# Patient Record
Sex: Female | Born: 1980 | Race: Black or African American | Hispanic: No | Marital: Single | Smoking: Former smoker
Health system: Southern US, Community
[De-identification: ages and names within clinical notes are randomized; demographics above are authoritative.]

## PROBLEM LIST (undated history)

## (undated) ENCOUNTER — Inpatient Hospital Stay (HOSPITAL_COMMUNITY): Payer: Self-pay

## (undated) DIAGNOSIS — D649 Anemia, unspecified: Secondary | ICD-10-CM

## (undated) DIAGNOSIS — N200 Calculus of kidney: Secondary | ICD-10-CM

## (undated) DIAGNOSIS — Z8489 Family history of other specified conditions: Secondary | ICD-10-CM

## (undated) DIAGNOSIS — E282 Polycystic ovarian syndrome: Secondary | ICD-10-CM

## (undated) DIAGNOSIS — E785 Hyperlipidemia, unspecified: Secondary | ICD-10-CM

## (undated) DIAGNOSIS — F32A Depression, unspecified: Secondary | ICD-10-CM

## (undated) DIAGNOSIS — E079 Disorder of thyroid, unspecified: Secondary | ICD-10-CM

## (undated) DIAGNOSIS — R51 Headache: Secondary | ICD-10-CM

## (undated) DIAGNOSIS — F419 Anxiety disorder, unspecified: Secondary | ICD-10-CM

## (undated) DIAGNOSIS — K219 Gastro-esophageal reflux disease without esophagitis: Secondary | ICD-10-CM

## (undated) DIAGNOSIS — F329 Major depressive disorder, single episode, unspecified: Secondary | ICD-10-CM

## (undated) HISTORY — DX: Anxiety disorder, unspecified: F41.9

## (undated) HISTORY — DX: Calculus of kidney: N20.0

## (undated) HISTORY — DX: Disorder of thyroid, unspecified: E07.9

## (undated) HISTORY — DX: Depression, unspecified: F32.A

## (undated) HISTORY — DX: Hyperlipidemia, unspecified: E78.5

## (undated) HISTORY — DX: Anemia, unspecified: D64.9

## (undated) HISTORY — DX: Major depressive disorder, single episode, unspecified: F32.9

## (undated) HISTORY — DX: Gastro-esophageal reflux disease without esophagitis: K21.9

---
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
## 2005-05-26 ENCOUNTER — Ambulatory Visit (HOSPITAL_COMMUNITY): Admission: RE | Admit: 2005-05-26 | Discharge: 2005-05-26 | Payer: Self-pay | Admitting: Family Medicine

## 2010-02-23 ENCOUNTER — Encounter: Payer: Self-pay | Admitting: Family Medicine

## 2010-11-01 ENCOUNTER — Inpatient Hospital Stay (HOSPITAL_COMMUNITY)
Admission: AD | Admit: 2010-11-01 | Discharge: 2010-11-01 | Disposition: A | Discharge status: Home / Self Care | Payer: Medicaid Other | Source: Ambulatory Visit | Attending: Obstetrics & Gynecology | Admitting: Obstetrics & Gynecology

## 2010-11-01 ENCOUNTER — Encounter (HOSPITAL_COMMUNITY): Payer: Self-pay

## 2010-11-01 DIAGNOSIS — N83202 Unspecified ovarian cyst, left side: Secondary | ICD-10-CM

## 2010-11-01 DIAGNOSIS — N83209 Unspecified ovarian cyst, unspecified side: Secondary | ICD-10-CM

## 2010-11-01 DIAGNOSIS — R1032 Left lower quadrant pain: Secondary | ICD-10-CM | POA: Insufficient documentation

## 2010-11-01 DIAGNOSIS — O99891 Other specified diseases and conditions complicating pregnancy: Secondary | ICD-10-CM | POA: Insufficient documentation

## 2010-11-01 DIAGNOSIS — Z3490 Encounter for supervision of normal pregnancy, unspecified, unspecified trimester: Secondary | ICD-10-CM

## 2010-11-01 HISTORY — DX: Headache: R51

## 2010-11-01 LAB — URINALYSIS, ROUTINE W REFLEX MICROSCOPIC
Hgb urine dipstick: NEGATIVE
Nitrite: NEGATIVE
Specific Gravity, Urine: >1.03 — ABNORMAL HIGH (ref 1.005–1.030)
pH: 6 (ref 5.0–8.0)

## 2010-11-01 LAB — WET PREP, GENITAL
Trich, Wet Prep: NONE SEEN
Yeast Wet Prep HPF POC: NONE SEEN

## 2010-11-01 LAB — POCT PREGNANCY, URINE: Preg Test, Ur: POSITIVE

## 2010-11-01 NOTE — Progress Notes (Signed)
Period for last few months have been a little irregular, no bleeding in September, two positive pregnancy tests at home, hurting on left side, went to ER in [place] had bloodwork and [place] too early to visualize a pregnancy, ?left ovarian cyst, LMP 09/05/10 no bleeding today pain still present.

## 2010-11-01 NOTE — Progress Notes (Signed)
Pt is [redacted] weeks pregnant went to ER in [place] and never found out if HCG levels were rising appropriately. Pt wanting to know if pregnancy is healthy. Pt is having L. Sided pain and is concerned about that.  Plan is for patient to move to [place] next week and start prenatal care there.

## 2010-11-01 NOTE — ED Provider Notes (Signed)
Chief Complaint:  Abdominal Pain   Jaclyn Macdonald is  30 y.o. [id number].  Patient's last menstrual period was 09/05/2010. [redacted]w[redacted]d  Her pregnancy status is positive.  She presents complaining of Abdominal Pain . Onset is described as gradual and has been present for  several weeks. Previous care in [place], states was told nothing visible on [place] and didn't follow up quants as instructed. Reports LLQ pain, was told in [place] that she had an ovarian cyst. Denies blding, discharge, dysuria, fever, or back pain.  Obstetrical/Gynecological History: OB History    Grav Para Term Preterm Abortions TAB SAB Ect Mult Living   5 2 2  2 1 1   2       Past Medical History: Past Medical History  Diagnosis Date  . Headache     Past Surgical History: Past Surgical History  Procedure Date  . No past surgeries     Family History: No family history on file.  Social History: History  Substance Use Topics  . Smoking status: Former Games developer  . Smokeless tobacco: Not on file  . Alcohol Use: No    Allergies: No Known Allergies  Prescriptions prior to admission  Medication Sig Dispense Refill  . OVER THE COUNTER MEDICATION Take 1 tablet by mouth daily as needed. For allergies       . prenatal vitamin w/FE, FA (PRENATAL 1 + 1) 27-1 MG TABS Take 1 tablet by mouth daily.          Review of Systems - Negative except what has been reviewed in the HPI  Physical Exam   Blood pressure 114/72, pulse 74, temperature 98.8 F (37.1 C), temperature source Oral, resp. rate 18, height 5\' 4"  (1.626 m), weight 72.303 kg (159 lb 6.4 oz), last menstrual period 09/05/2010.  General: General appearance - alert, well appearing, and in no distress and oriented to person, place, and time Abdomen - tenderness noted LLQ, no rebound, no guarding, +BS Focused Gynecological Exam: VULVA: normal appearing vulva with no masses, tenderness or lesions, VAGINA: normal appearing vagina with normal color and discharge, no  lesions, CERVIX: normal appearing cervix without discharge or lesions, UTERUS: uterus is normal size, shape, consistency and nontender, ADNEXA: normal adnexa in size, nontender and no masses  Labs: Recent Results (from the past 24 hour(s))  URINALYSIS, ROUTINE W REFLEX MICROSCOPIC   Collection Time   11/01/10  5:40 PM      Component Value Range   Color, Urine YELLOW  YELLOW    Appearance CLEAR  CLEAR    Specific Gravity, Urine >1.030 (*) 1.005 - 1.030    pH 6.0  5.0 - 8.0    Glucose, UA NEGATIVE  NEGATIVE (mg/dL)   Hgb urine dipstick NEGATIVE  NEGATIVE    Bilirubin Urine NEGATIVE  NEGATIVE    Ketones, ur NEGATIVE  NEGATIVE (mg/dL)   Protein, ur NEGATIVE  NEGATIVE (mg/dL)   Urobilinogen, UA 0.2  0.0 - 1.0 (mg/dL)   Nitrite NEGATIVE  NEGATIVE    Leukocytes, UA NEGATIVE  NEGATIVE   POCT PREGNANCY, URINE   Collection Time   11/01/10  5:45 PM      Component Value Range   Preg Test, Ur POSITIVE    WET PREP, GENITAL   Collection Time   11/01/10  7:15 PM      Component Value Range   Yeast, Wet Prep NONE SEEN  NONE SEEN    Trich, Wet Prep NONE SEEN  NONE SEEN    Clue Cells, Wet Prep  NONE SEEN  NONE SEEN    WBC, Wet Prep HPF POC FEW (*) NONE SEEN    Imaging Studies:  Viable IUP. CRL [redacted]w[redacted]d, c/w LMP, + cardiac activity, + yolk sac   Assessment: Viable IUP at [redacted]w[redacted]d Pelvic Pain  Plan: Discharge home FU with OB/Gyn as scheduled in [place]  Lilianna Case E. 11/01/2010,8:03 PM

## 2011-04-13 ENCOUNTER — Encounter (HOSPITAL_COMMUNITY): Payer: Self-pay | Admitting: Advanced Practice Midwife

## 2011-04-13 ENCOUNTER — Inpatient Hospital Stay (HOSPITAL_COMMUNITY)
Admission: AD | Admit: 2011-04-13 | Discharge: 2011-04-14 | Disposition: A | Discharge status: Home Health | Payer: Medicaid Other | Attending: Obstetrics & Gynecology | Admitting: Obstetrics & Gynecology

## 2011-04-13 DIAGNOSIS — O2240 Hemorrhoids in pregnancy, unspecified trimester: Secondary | ICD-10-CM

## 2011-04-13 DIAGNOSIS — K649 Unspecified hemorrhoids: Secondary | ICD-10-CM | POA: Insufficient documentation

## 2011-04-13 DIAGNOSIS — O228X9 Other venous complications in pregnancy, unspecified trimester: Secondary | ICD-10-CM | POA: Insufficient documentation

## 2011-04-13 NOTE — MAU Note (Signed)
Pt states she urinatted and when she got up there was blood in the commode-she wiped and found it to be coming from her rectum-sttes it was not her vagina

## 2011-04-13 NOTE — MAU Note (Signed)
Pt was up tovoid at home and noticed blood in toilet when she wiped.  Denies any burning when she voids.  State she does have hemmoroids.

## 2011-04-14 ENCOUNTER — Encounter (HOSPITAL_COMMUNITY): Payer: Self-pay | Admitting: Family

## 2011-04-14 LAB — URINALYSIS, ROUTINE W REFLEX MICROSCOPIC
Glucose, UA: NEGATIVE mg/dL
Ketones, ur: NEGATIVE mg/dL
Leukocytes, UA: NEGATIVE
Nitrite: NEGATIVE
Protein, ur: NEGATIVE mg/dL
Specific Gravity, Urine: 1.02 (ref 1.005–1.030)
Urobilinogen, UA: 0.2 mg/dL (ref 0.0–1.0)
pH: 6.5 (ref 5.0–8.0)

## 2011-04-14 NOTE — Discharge Instructions (Signed)
Hemorrhoids Hemorrhoids are veins in the rectum that get big. These veins can get blocked. Blocked veins become puffy (swollen) and painful. HOME CARE  Eat more fiber.   Drink enough fluid to keep your pee (urine) clear or pale yellow.   Exercise often.   Avoid straining to poop (bowel movement).   Keep the butt area dry and clean.   Only take medicine as told by your doctor.  If your hemorrhoids are puffy and painful:  Take a warm bath for 20 to 30 minutes. Do this 3 to 4 times a day.   Place ice packs on the area. Use the ice packs between the baths.   Put ice in a plastic bag.   Place a towel between your skin and the bag.   Leave the ice on for 15 to 20 minutes, 3 to 4 times a day.   Do not use a donut-shaped pillow. Do not sit on the toilet for a long time.   Go to the bathroom when your body has the urge to poop. This is so you do not strain as much to poop.  GET HELP RIGHT AWAY IF:   You have increasing pain that is not controlled with medicine.   You have uncontrolled bleeding.   You cannot poop.   You have pain or puffiness outside the area of the hemorrhoids.   You have chills.   You have a temperature by mouth above 102 F (38.9 C), not controlled by medicine.  MAKE SURE YOU:   Understand these instructions.   Will watch your condition.   Will get help right away if you are not doing well or get worse.  Document Released: 10/29/2007 Document Revised: 01/08/2011 Document Reviewed: 10/29/2007 [facility] Patient Information 2012 [place], [place].

## 2011-04-14 NOTE — MAU Provider Note (Signed)
  History     CSN: [id number]  Arrival date and time: 04/13/11 2310   First Provider Initiated Contact with Patient 04/14/11 0005      Chief Complaint  Patient presents with  . Rectal Bleeding   HPI  Pt here with report of blood with wiping that started today.  Used a wipe and noticed a small clot on tissue with wiping.  Denies pain at hemorrhoids, UTI symptoms, vaginal bleeding or contractions.    Past Medical History  Diagnosis Date  . Headache     Past Surgical History  Procedure Date  . No past surgeries     History reviewed. No pertinent family history.  History  Substance Use Topics  . Smoking status: Former Smoker    Types: Cigarettes    Quit date: 03/30/2011  . Smokeless tobacco: Not on file  . Alcohol Use: No    Allergies: No Known Allergies  Prescriptions prior to admission  Medication Sig Dispense Refill  . Prenatal Vit-Fe Fumarate-FA (PRENATAL MULTIVITAMIN) TABS Take 1 tablet by mouth daily.        Review of Systems  Genitourinary:       Rectal bleeding  All other systems reviewed and are negative.   Physical Exam   Blood pressure 115/68, pulse 110, temperature 98.4 F (36.9 C), temperature source Oral, resp. rate 20, height 5' 4.5" (1.638 m), weight 91.173 kg (201 lb), last menstrual period 09/05/2010, SpO2 99.00%.  Physical Exam  Constitutional: She is oriented to person, place, and time. She appears well-developed and well-nourished. No distress.  HENT:  Head: Normocephalic.  Neck: Normal range of motion. Neck supple.  Cardiovascular: Normal rate, regular rhythm and normal heart sounds.   Respiratory: Effort normal and breath sounds normal.  Genitourinary: Rectal exam shows external hemorrhoid (no active bleeding seen) and internal hemorrhoid (no active bleeding seen). No bleeding (cervix closed) around the vagina.  Neurological: She is alert and oriented to person, place, and time.  Skin: Skin is warm and dry.    MAU Course    Procedures Results for orders placed during the hospital encounter of 04/13/11 (from the past 24 hour(s))  URINALYSIS, ROUTINE W REFLEX MICROSCOPIC     Status: Normal   Collection Time   04/13/11 11:15 PM      Component Value Range   Color, Urine YELLOW  YELLOW    APPearance CLEAR  CLEAR    Specific Gravity, Urine 1.020  1.005 - 1.030    pH 6.5  5.0 - 8.0    Glucose, UA NEGATIVE  NEGATIVE (mg/dL)   Hgb urine dipstick NEGATIVE  NEGATIVE    Bilirubin Urine NEGATIVE  NEGATIVE    Ketones, ur NEGATIVE  NEGATIVE (mg/dL)   Protein, ur NEGATIVE  NEGATIVE (mg/dL)   Urobilinogen, UA 0.2  0.0 - 1.0 (mg/dL)   Nitrite NEGATIVE  NEGATIVE    Leukocytes, UA NEGATIVE  NEGATIVE    FHR 140's, +accel, reactive Toco - none  Assessment and Plan  Hemorrhoids  Plan: DC home Increase fiber F/U with scheduled prenatal care visit.  [facility] 04/14/2011, 12:06 AM

## 2011-04-14 NOTE — Progress Notes (Signed)
One sm hemmoroid on left rectal wall.

## 2011-04-25 NOTE — MAU Provider Note (Signed)
Medical Screening exam and patient care preformed by advanced practice provider.  Agree with the above management.  

## 2013-12-04 ENCOUNTER — Encounter (HOSPITAL_COMMUNITY): Payer: Self-pay | Admitting: Family

## 2014-09-18 ENCOUNTER — Ambulatory Visit (INDEPENDENT_AMBULATORY_CARE_PROVIDER_SITE_OTHER): Payer: Medicare Other | Admitting: Neurology

## 2014-09-18 ENCOUNTER — Encounter: Payer: Self-pay | Admitting: Neurology

## 2014-09-18 VITALS — BP 107/70 | HR 77 | Ht 64.5 in | Wt 167.0 lb

## 2014-09-18 DIAGNOSIS — G5601 Carpal tunnel syndrome, right upper limb: Secondary | ICD-10-CM

## 2014-09-18 NOTE — Progress Notes (Signed)
PATIENT: Jaclyn Macdonald DOB: 1980/11/25  Chief Complaint  Patient presents with  . Numbness    She has been having right-handed numbness and tingling for the last 1.5 years.  She has also developed pain in her right wrist that becomes worse with positional changes.  She has some relief wearing an immobilization brace.     HISTORICAL  Jaclyn Macdonald is a 34 years old right-handed female, seen in refer by her primary care Scott Long for evaluation of right hand paresthesia.  She reported a history of depression anxiety, I also reviewed and summarized her most recent office visit, laboratory evaluation normal A1c 5.7, TSH, 0.587, normal CMP, lipid profile showed triglyceride 131, LDL 103, normal CBC, with exception of mild anemia 10.8  She began to notice right hand paresthesia since 2014, there was no clear trigger notice, initially she also woke up in the middle of the night felt dense numbness of her right hand, she has to shake her hand to make the sensation comes back.  Over the past few months, is getting worse, she has right hand paresthesia during the daytime, numbness tingling, drawing sensation of her right hand, she denies significant neck pain, no right hand weakness. She denies left hand sensory loss or weakness, she denies gait difficulty    REVIEW OF SYSTEMS: Full 14 system review of systems performed and notable only for fatigue, feeling hot, feeling cold, joint pain, numbness, weakness, sleepiness, depression, anxiety, decreased energy, change in appetite, disinteresting activities  ALLERGIES: No Known Allergies  HOME MEDICATIONS: Current Outpatient Prescriptions  Medication Sig Dispense Refill  . ferrous sulfate 325 (65 FE) MG tablet Take 325 mg by mouth 2 (two) times daily.  0  . VITAMIN D, ERGOCALCIFEROL, PO Take by mouth daily.     No current facility-administered medications for this visit.    PAST MEDICAL HISTORY: Past Medical History  Diagnosis Date    . Headache(784.0)   . Depression   . Anxiety     PAST SURGICAL HISTORY: Past Surgical History  Procedure Laterality Date  . Cesarean section  2013    FAMILY HISTORY: Family History  Problem Relation Age of Onset  . Hypertension Father   . Heart disease Father   . Asthma Mother   . Rashes / Skin problems Mother     SOCIAL HISTORY:  Social History   Social History  . Marital Status: Single    Spouse Name: N/A  . Number of Children: 3  . Years of Education: 14   Occupational History  . Unemployed    Social History Main Topics  . Smoking status: Former Smoker -- 0.80 packs/day    Types: Cigarettes    Quit date: 03/30/2011  . Smokeless tobacco: Not on file  . Alcohol Use: 0.0 oz/week    0 Standard drinks or equivalent per week     Comment: Two drinks per week.  . Drug Use: No  . Sexual Activity: Yes   Other Topics Concern  . Not on file   Social History Narrative   Lives at home with three children.   Right-handed.   1 cup coffee per day.     PHYSICAL EXAM   Filed Vitals:   09/18/14 1023  BP: 107/70  Pulse: 77  Height: 5' 4.5" (1.638 m)  Weight: 167 lb (75.751 kg)    Not recorded      Body mass index is 28.23 kg/(m^2).  PHYSICAL EXAMNIATION:  Gen: NAD, conversant, well nourised, obese, well  groomed                     Cardiovascular: Regular rate rhythm, no peripheral edema, warm, nontender. Eyes: Conjunctivae clear without exudates or hemorrhage Neck: Supple, no carotid bruise. Pulmonary: Clear to auscultation bilaterally   NEUROLOGICAL EXAM:  MENTAL STATUS: Speech:    Speech is normal; fluent and spontaneous with normal comprehension.  Cognition:     Orientation to time, place and person     Normal recent and remote memory     Normal Attention span and concentration     Normal Language, naming, repeating,spontaneous speech     Fund of knowledge   CRANIAL NERVES: CN II: Visual fields are full to confrontation. Fundoscopic exam is  normal with sharp discs and no vascular changes. Pupils are round equal and briskly reactive to light. CN III, IV, VI: extraocular movement are normal. No ptosis. CN V: Facial sensation is intact to pinprick in all 3 divisions bilaterally. Corneal responses are intact.  CN VII: Face is symmetric with normal eye closure and smile. CN VIII: Hearing is normal to rubbing fingers CN IX, X: Palate elevates symmetrically. Phonation is normal. CN XI: Head turning and shoulder shrug are intact CN XII: Tongue is midline with normal movements and no atrophy.  MOTOR: There is no pronator drift of out-stretched arms. Muscle bulk and tone are normal. Muscle strength is normal.  REFLEXES: Reflexes are 2+ and symmetric at the biceps, triceps, knees, and ankles. Plantar responses are flexor.  SENSORY: Intact to light touch, pinprick, position sense, and vibration sense are intact in fingers and toes with exception of decreased light touch pinprick at right first 3 fingerpads, she has right wrist Tinel sign.  COORDINATION: Rapid alternating movements and fine finger movements are intact. There is no dysmetria on finger-to-nose and heel-knee-shin.    GAIT/STANCE: Posture is normal. Gait is steady with normal steps, base, arm swing, and turning. Heel and toe walking are normal. Tandem gait is normal.  Romberg is absent.   DIAGNOSTIC DATA (LABS, IMAGING, TESTING) - I reviewed patient records, labs, notes, testing and imaging myself where available.  ASSESSMENT AND PLAN  Jaclyn Macdonald is a 34 y.o. female   Right hand paresthesia  Most consistent with right carpal tunnel syndromes  EMG nerve conduction study  Prescription for right wrist splint  When necessary NSAIDs  Marcial Pacas, M.D. Ph.D.  [facility] [address] [place],  [address] Ph: [phone] Fax: [phone]  CC: Shanon Rosser

## 2014-10-22 ENCOUNTER — Ambulatory Visit (INDEPENDENT_AMBULATORY_CARE_PROVIDER_SITE_OTHER): Payer: Medicare Other | Admitting: Neurology

## 2014-10-22 ENCOUNTER — Ambulatory Visit (INDEPENDENT_AMBULATORY_CARE_PROVIDER_SITE_OTHER): Payer: Self-pay | Admitting: Neurology

## 2014-10-22 DIAGNOSIS — R202 Paresthesia of skin: Secondary | ICD-10-CM

## 2014-10-22 DIAGNOSIS — G5601 Carpal tunnel syndrome, right upper limb: Secondary | ICD-10-CM

## 2014-10-22 DIAGNOSIS — Z0289 Encounter for other administrative examinations: Secondary | ICD-10-CM

## 2014-10-22 NOTE — Procedures (Signed)
   NCS (NERVE CONDUCTION STUDY) WITH EMG (ELECTROMYOGRAPHY) REPORT   STUDY DATE: October 22 2014 PATIENT NAME: Jaclyn Macdonald DOB: [birth year] MRN: [id number]    TECHNOLOGIST: Laretta Alstrom ELECTROMYOGRAPHER: Marcial Pacas M.D.  CLINICAL INFORMATION:  34 years old right-handed female, with intermittent bilateral hands paresthesia since 2014  FINDINGS: NERVE CONDUCTION STUDY: Bilateral ulnar sensory and motor responses were normal. Bilateral median sensory and motor responses were normal and symmetric.  NEEDLE ELECTROMYOGRAPHY: Selected needle examination was performed at right upper extremity muscles, right cervical paraspinal muscles.  Needle examination of right pronator teres, brachioradialis, triceps, biceps deltoid was normal.  There was no spontaneous activity at right cervical paraspinal muscles, right C5-6 and 7.  IMPRESSION:   This is a normal study. There is no electrodiagnostic evidence of right upper extremity neuropathy, or right cervical radiculopathy.   INTERPRETING PHYSICIAN:   Marcial Pacas M.D. Ph.D. [facility] [address] [place], [place] [address] [phone]

## 2014-10-22 NOTE — Progress Notes (Signed)
Electrodiagnostic study is normal today, there is no evidence of right carpal tunnel syndromes or right cervical radiculopathy.  Her right hand paresthesia has responded to right wrist splint.

## 2015-03-07 ENCOUNTER — Other Ambulatory Visit: Payer: Self-pay | Admitting: Internal Medicine

## 2015-03-07 DIAGNOSIS — R7989 Other specified abnormal findings of blood chemistry: Secondary | ICD-10-CM

## 2015-03-13 ENCOUNTER — Ambulatory Visit
Admission: RE | Admit: 2015-03-13 | Discharge: 2015-03-13 | Disposition: A | Discharge status: Home / Self Care | Payer: Medicare Other | Source: Ambulatory Visit | Attending: Internal Medicine | Admitting: Internal Medicine

## 2015-03-13 DIAGNOSIS — R7989 Other specified abnormal findings of blood chemistry: Secondary | ICD-10-CM

## 2015-04-03 ENCOUNTER — Other Ambulatory Visit (HOSPITAL_COMMUNITY): Payer: Self-pay | Admitting: Internal Medicine

## 2015-04-03 DIAGNOSIS — R7989 Other specified abnormal findings of blood chemistry: Secondary | ICD-10-CM

## 2015-04-18 ENCOUNTER — Encounter (HOSPITAL_COMMUNITY)
Admission: RE | Admit: 2015-04-18 | Discharge: 2015-04-18 | Disposition: A | Discharge status: Home / Self Care | Payer: Medicare Other | Source: Ambulatory Visit | Attending: Internal Medicine | Admitting: Internal Medicine

## 2015-04-18 DIAGNOSIS — R7989 Other specified abnormal findings of blood chemistry: Secondary | ICD-10-CM

## 2015-04-18 DIAGNOSIS — R946 Abnormal results of thyroid function studies: Secondary | ICD-10-CM | POA: Insufficient documentation

## 2015-04-18 MED ORDER — SODIUM IODIDE I 131 CAPSULE
12.2000 | Freq: Once | INTRAVENOUS | Status: AC
  Administered 2015-04-18: 12.2 via ORAL

## 2015-04-19 ENCOUNTER — Encounter (HOSPITAL_COMMUNITY)
Admission: RE | Admit: 2015-04-19 | Discharge: 2015-04-19 | Disposition: A | Discharge status: Home / Self Care | Payer: Medicare Other | Source: Ambulatory Visit | Attending: Internal Medicine | Admitting: Internal Medicine

## 2015-04-19 DIAGNOSIS — R946 Abnormal results of thyroid function studies: Secondary | ICD-10-CM | POA: Diagnosis present

## 2016-05-15 DIAGNOSIS — E282 Polycystic ovarian syndrome: Secondary | ICD-10-CM

## 2016-05-15 HISTORY — DX: Polycystic ovarian syndrome: E28.2

## 2018-10-27 DIAGNOSIS — F411 Generalized anxiety disorder: Secondary | ICD-10-CM | POA: Diagnosis not present

## 2018-11-01 ENCOUNTER — Other Ambulatory Visit: Payer: Self-pay | Admitting: *Deleted

## 2018-11-01 DIAGNOSIS — N644 Mastodynia: Secondary | ICD-10-CM

## 2018-11-09 ENCOUNTER — Encounter: Payer: Self-pay | Admitting: *Deleted

## 2018-11-10 ENCOUNTER — Encounter

## 2018-11-10 ENCOUNTER — Encounter: Payer: Self-pay | Admitting: Neurology

## 2018-11-10 ENCOUNTER — Ambulatory Visit: Payer: Medicare Other

## 2018-11-10 ENCOUNTER — Ambulatory Visit
Admission: RE | Admit: 2018-11-10 | Discharge: 2018-11-10 | Disposition: A | Discharge status: Home / Self Care | Payer: Medicare HMO | Source: Ambulatory Visit | Attending: *Deleted | Admitting: *Deleted

## 2018-11-10 ENCOUNTER — Other Ambulatory Visit: Payer: Self-pay

## 2018-11-10 ENCOUNTER — Ambulatory Visit (INDEPENDENT_AMBULATORY_CARE_PROVIDER_SITE_OTHER): Payer: Medicare HMO | Admitting: Neurology

## 2018-11-10 VITALS — BP 128/86 | HR 83 | Temp 97.6°F | Ht 64.5 in | Wt 168.0 lb

## 2018-11-10 DIAGNOSIS — R202 Paresthesia of skin: Secondary | ICD-10-CM | POA: Diagnosis not present

## 2018-11-10 DIAGNOSIS — R531 Weakness: Secondary | ICD-10-CM | POA: Diagnosis not present

## 2018-11-10 DIAGNOSIS — R928 Other abnormal and inconclusive findings on diagnostic imaging of breast: Secondary | ICD-10-CM | POA: Diagnosis not present

## 2018-11-10 DIAGNOSIS — M542 Cervicalgia: Secondary | ICD-10-CM | POA: Diagnosis not present

## 2018-11-10 DIAGNOSIS — F411 Generalized anxiety disorder: Secondary | ICD-10-CM | POA: Diagnosis not present

## 2018-11-10 DIAGNOSIS — N644 Mastodynia: Secondary | ICD-10-CM

## 2018-11-10 NOTE — Progress Notes (Addendum)
PATIENT: Jaclyn Macdonald DOB: 30-Jun-1980  Chief Complaint  Patient presents with  . Paresthesia of skin    rm 4, New Pt, seen 2016 "mostly my right arm w/tingling, numbness esp with hot weather; hands were tembling but better now"     HISTORICAL  Jaclyn Macdonald is a 38 year old female, seen in request by primary care physician PA Scott long for evaluation of right numbness and weakness.  I have reviewed and summarized the referring note from the referring physician.  She had a past medical history of diabetes, noticed right arm sometimes weakness since 2014, while driving, her right arm follow-up the steering wheel, saw her previously in 2016 for similar complaints, EMG nerve conduction study showed no evidence of right upper extremity neuropathy or right cervical radiculopathy  Her symptoms has been progressively getting worse, she also complains of neck pain, stiffness of her neck, intermittent radiating pain to left shoulder, left upper extremity  Laboratory evaluation TSH 0.34, hemoglobin of 12.6, LDL of 98 CMP A1c 5.4 LDL 98, cholesterol 172 vitamin B12 508  REVIEW OF SYSTEMS: Full 14 system review of systems performed and notable only for as above All other review of systems were negative.  ALLERGIES: No Known Allergies  HOME MEDICATIONS: Current Outpatient Medications  Medication Sig Dispense Refill  . ferrous sulfate 325 (65 FE) MG tablet Take 325 mg by mouth 2 (two) times daily.  0  . VITAMIN D, ERGOCALCIFEROL, PO Take by mouth daily.    . metFORMIN (GLUCOPHAGE) 500 MG tablet 11/10/18 Not started yet     No current facility-administered medications for this visit.     PAST MEDICAL HISTORY: Past Medical History:  Diagnosis Date  . Anxiety   . Depression   . Headache(784.0)   . Polycystic ovaries 05/15/2016    PAST SURGICAL HISTORY: Past Surgical History:  Procedure Laterality Date  . CESAREAN SECTION  2013    FAMILY HISTORY: Family History  Problem  Relation Age of Onset  . Asthma Mother   . Rashes / Skin problems Mother   . Hypertension Mother   . Hypertension Father   . Heart disease Father     SOCIAL HISTORY: Social History   Socioeconomic History  . Marital status: Single    Spouse name: Not on file  . Number of children: 3  . Years of education: 15  . Highest education level: Not on file  Occupational History  . Occupation: Unemployed  Social Needs  . Financial resource strain: Not on file  . Food insecurity    Worry: Not on file    Inability: Not on file  . Transportation needs    Medical: Not on file    Non-medical: Not on file  Tobacco Use  . Smoking status: Former Smoker    Packs/day: 0.80    Types: Cigarettes    Quit date: 03/30/2011    Years since quitting: 7.6  . Smokeless tobacco: Never Used  Substance and Sexual Activity  . Alcohol use: Yes    Alcohol/week: 0.0 standard drinks    Comment: Two drinks per week.  . Drug use: No  . Sexual activity: Yes  Lifestyle  . Physical activity    Days per week: Not on file    Minutes per session: Not on file  . Stress: Not on file  Relationships  . Social Herbalist on phone: Not on file    Gets together: Not on file    Attends religious service: Not  on file    Active member of club or organization: Not on file    Attends meetings of clubs or organizations: Not on file    Relationship status: Not on file  . Intimate partner violence    Fear of current or ex partner: Not on file    Emotionally abused: Not on file    Physically abused: Not on file    Forced sexual activity: Not on file  Other Topics Concern  . Not on file  Social History Narrative   Lives at home with three children.   Right-handed.   1 cup coffee per day.     PHYSICAL EXAM   Vitals:   11/10/18 0812  BP: 128/86  Pulse: 83  Temp: 97.6 F (36.4 C)  Weight: 168 lb (76.2 kg)  Height: 5' 4.5" (1.638 m)    Not recorded      Body mass index is 28.39 kg/m.   PHYSICAL EXAMNIATION:  Gen: NAD, conversant, well nourised, well groomed                     Cardiovascular: Regular rate rhythm, no peripheral edema, warm, nontender. Eyes: Conjunctivae clear without exudates or hemorrhage Neck: Supple, no carotid bruits. Pulmonary: Clear to auscultation bilaterally   NEUROLOGICAL EXAM:  MENTAL STATUS: Speech:    Speech is normal; fluent and spontaneous with normal comprehension.  Cognition:     Orientation to time, place and person     Normal recent and remote memory     Normal Attention span and concentration     Normal Language, naming, repeating,spontaneous speech     Fund of knowledge   CRANIAL NERVES: CN II: Visual fields are full to confrontation.  Pupils are round equal and briskly reactive to light. CN III, IV, VI: extraocular movement are normal. No ptosis. CN V: Facial sensation is intact to pinprick in all 3 divisions bilaterally. Corneal responses are intact.  CN VII: Face is symmetric with normal eye closure and smile. CN VIII: Hearing is normal to causal conversation. CN IX, X: Palate elevates symmetrically. Phonation is normal. CN XI: Head turning and shoulder shrug are intact CN XII: Tongue is midline with normal movements and no atrophy.  MOTOR: There is no pronator drift of out-stretched arms. Muscle bulk and tone are normal. Muscle strength is normal.  REFLEXES: Reflexes are 2+ and symmetric at the biceps, triceps, knees, and ankles. Plantar responses are flexor.  SENSORY: Intact to light touch, pinprick, positional sensation and vibratory sensation are intact in fingers and toes.  COORDINATION: Rapid alternating movements and fine finger movements are intact. There is no dysmetria on finger-to-nose and heel-knee-shin.    GAIT/STANCE: Posture is normal. Gait is steady with normal steps, base, arm swing, and turning. Heel and toe walking are normal. Tandem gait is normal.  Romberg is absent.   DIAGNOSTIC DATA (LABS,  IMAGING, TESTING) - I reviewed patient records, labs, notes, testing and imaging myself where available.   ASSESSMENT AND PLAN  Jaclyn Macdonald is a 38 y.o. female   Right arm paresthesia Neck pain stiffness  MRI of cervical spine to rule out cervical radiculopathy  EMG nerve conduction study  Jaclyn Macdonald, M.D. Ph.D.  [facility] [address] [place], [place] [address] Ph: [phone] Fax: [phone]  CC: Referring Provider

## 2018-11-14 ENCOUNTER — Telehealth: Payer: Self-pay | Admitting: Neurology

## 2018-11-14 NOTE — Telephone Encounter (Signed)
[employer] Auth: [id number] (exp. 11/14/18 to 12/14/18)/medicaid order sent to GI. They will reach out to the patient to schedule.

## 2018-11-14 NOTE — Telephone Encounter (Signed)
[employer] pending faxed notes/medicaid

## 2018-11-30 ENCOUNTER — Other Ambulatory Visit: Payer: Self-pay

## 2018-11-30 ENCOUNTER — Ambulatory Visit
Admission: RE | Admit: 2018-11-30 | Discharge: 2018-11-30 | Disposition: A | Discharge status: Home / Self Care | Payer: Medicare Other | Source: Ambulatory Visit | Attending: Neurology | Admitting: Neurology

## 2018-11-30 DIAGNOSIS — R202 Paresthesia of skin: Secondary | ICD-10-CM | POA: Diagnosis not present

## 2018-11-30 DIAGNOSIS — M542 Cervicalgia: Secondary | ICD-10-CM

## 2018-11-30 DIAGNOSIS — R531 Weakness: Secondary | ICD-10-CM

## 2018-12-05 ENCOUNTER — Telehealth: Payer: Self-pay | Admitting: Neurology

## 2018-12-05 NOTE — Telephone Encounter (Signed)
Please call patient, MRI of cervical spine showed mild degenerative changes, there was no significant canal or foraminal narrowing.    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 - No spinal stenosis or foraminal narrowing.

## 2018-12-05 NOTE — Telephone Encounter (Addendum)
I have spoken to the patient and she verbalized understanding of the results.  She will keep her pending appt for her NCV/EMG.

## 2018-12-08 DIAGNOSIS — H52223 Regular astigmatism, bilateral: Secondary | ICD-10-CM | POA: Diagnosis not present

## 2018-12-12 DIAGNOSIS — F411 Generalized anxiety disorder: Secondary | ICD-10-CM | POA: Diagnosis not present

## 2018-12-28 ENCOUNTER — Encounter: Payer: Medicare HMO | Admitting: Neurology

## 2018-12-28 DIAGNOSIS — F411 Generalized anxiety disorder: Secondary | ICD-10-CM | POA: Diagnosis not present

## 2019-01-11 DIAGNOSIS — F411 Generalized anxiety disorder: Secondary | ICD-10-CM | POA: Diagnosis not present

## 2019-01-18 ENCOUNTER — Encounter (INDEPENDENT_AMBULATORY_CARE_PROVIDER_SITE_OTHER): Payer: Medicare HMO | Admitting: Neurology

## 2019-01-18 ENCOUNTER — Ambulatory Visit (INDEPENDENT_AMBULATORY_CARE_PROVIDER_SITE_OTHER): Payer: Medicare HMO | Admitting: Neurology

## 2019-01-18 ENCOUNTER — Other Ambulatory Visit: Payer: Self-pay

## 2019-01-18 DIAGNOSIS — R531 Weakness: Secondary | ICD-10-CM

## 2019-01-18 DIAGNOSIS — M542 Cervicalgia: Secondary | ICD-10-CM

## 2019-01-18 DIAGNOSIS — R202 Paresthesia of skin: Secondary | ICD-10-CM

## 2019-01-18 DIAGNOSIS — Z0289 Encounter for other administrative examinations: Secondary | ICD-10-CM

## 2019-01-18 NOTE — Procedures (Signed)
Full Name: Jaclyn Macdonald Gender: Female MRN #: [id number] Date of Birth: [birth year]    Visit Date: 01/18/2019 09:56 Age: 38 Years Examining Physician: Marcial Pacas, MD  Referring Physician:  Marcial Pacas, MD History: 38 year old female, complains of intermittent bilateral hands paresthesia, right wrist discomfort  Summary of the tests:  Nerve conduction study:  Bilateral median, ulnar sensory motor responses were normal.  Electromyography:  Selected needle examination of right upper extremity muscles was normal.  Conclusion: This is a normal study.  There is no electrodiagnostic evidence of right upper extremity neuropathy or right cervical radiculopathy.    ------------------------------- Marcial Pacas, M.D. PhD  [facility] [place], [place] [address] Tel: [phone] Fax: [phone]         [facility]    Nerve / Sites Muscle Latency Ref. Amplitude Ref. Rel Amp Segments Distance Velocity Ref. Area    ms ms mV mV %  cm m/s m/s mVms  R Median - APB     Wrist APB 3.8 ?4.4 9.2 ?4.0 100 Wrist - APB 7   34.0     Upper arm APB 7.7  8.6  93.9 Upper arm - Wrist 21 53 ?49 33.1  L Median - APB     Wrist APB 3.9 ?4.4 8.3 ?4.0 100 Wrist - APB 7   24.9     Upper arm APB 8.0  8.2  98 Upper arm - Wrist 21 51 ?49 24.0  R Ulnar - ADM     Wrist ADM 2.9 ?3.3 16.1 ?6.0 100 Wrist - ADM 7   44.6     B.Elbow ADM 6.5  15.9  99.1 B.Elbow - Wrist 20 55 ?49 45.0     A.Elbow ADM 8.2  15.8  99.5 A.Elbow - B.Elbow 10 57 ?49 45.4         A.Elbow - Wrist      L Ulnar - ADM     Wrist ADM 2.6 ?3.3 11.9 ?6.0 100 Wrist - ADM 7   37.5     B.Elbow ADM 6.4  10.7  90.1 B.Elbow - Wrist 20 52 ?49 34.4     A.Elbow ADM 8.3  10.8  101 A.Elbow - B.Elbow 10 53 ?49 35.1         A.Elbow - Wrist                 SNC    Nerve / Sites Rec. Site Peak Lat Ref.  Amp Ref. Segments Distance    ms ms V V  cm  R Median - Orthodromic (Dig II, Mid palm)     Dig II Wrist 3.3 ?3.4 11 ?10  Dig II - Wrist 13  L Median - Orthodromic (Dig II, Mid palm)     Dig II Wrist 3.0 ?3.4 15 ?10 Dig II - Wrist 13  R Ulnar - Orthodromic, (Dig V, Mid palm)     Dig V Wrist 2.9 ?3.1 8 ?5 Dig V - Wrist 11  L Ulnar - Orthodromic, (Dig V, Mid palm)     Dig V Wrist 2.4 ?3.1 10 ?5 Dig V - Wrist 78             F  Wave    Nerve F Lat Ref.   ms ms  R Ulnar - ADM 28.2 ?32.0  L Ulnar - ADM 27.0 ?32.0         EMG Summary Table    Spontaneous MUAP Recruitment  Muscle  IA Fib PSW Fasc Other Amp Dur. Poly Pattern  R. First dorsal interosseous Normal None None None _______ Normal Normal Normal Normal  R. Pronator teres Normal None None None _______ Normal Normal Normal Normal  R. Biceps brachii Normal None None None _______ Normal Normal Normal Normal  R. Deltoid Normal None None None _______ Normal Normal Normal Normal  R. Triceps brachii Normal None None None _______ Normal Normal Normal Normal  R. Extensor digitorum communis Normal None None None _______ Normal Normal Normal Normal

## 2019-02-08 DIAGNOSIS — F411 Generalized anxiety disorder: Secondary | ICD-10-CM | POA: Diagnosis not present

## 2019-02-15 DIAGNOSIS — Z720 Tobacco use: Secondary | ICD-10-CM | POA: Diagnosis not present

## 2019-02-15 DIAGNOSIS — Z5181 Encounter for therapeutic drug level monitoring: Secondary | ICD-10-CM | POA: Diagnosis not present

## 2019-02-15 DIAGNOSIS — Z7189 Other specified counseling: Secondary | ICD-10-CM | POA: Diagnosis not present

## 2019-02-15 DIAGNOSIS — R946 Abnormal results of thyroid function studies: Secondary | ICD-10-CM | POA: Diagnosis not present

## 2019-02-15 DIAGNOSIS — D509 Iron deficiency anemia, unspecified: Secondary | ICD-10-CM | POA: Diagnosis not present

## 2019-02-15 DIAGNOSIS — L680 Hirsutism: Secondary | ICD-10-CM | POA: Diagnosis not present

## 2019-02-15 DIAGNOSIS — Z131 Encounter for screening for diabetes mellitus: Secondary | ICD-10-CM | POA: Diagnosis not present

## 2019-02-15 DIAGNOSIS — L700 Acne vulgaris: Secondary | ICD-10-CM | POA: Diagnosis not present

## 2019-02-15 DIAGNOSIS — E282 Polycystic ovarian syndrome: Secondary | ICD-10-CM | POA: Diagnosis not present

## 2019-02-22 DIAGNOSIS — F411 Generalized anxiety disorder: Secondary | ICD-10-CM | POA: Diagnosis not present

## 2019-02-25 DIAGNOSIS — N921 Excessive and frequent menstruation with irregular cycle: Secondary | ICD-10-CM | POA: Diagnosis not present

## 2019-02-25 DIAGNOSIS — D509 Iron deficiency anemia, unspecified: Secondary | ICD-10-CM | POA: Diagnosis not present

## 2019-02-25 DIAGNOSIS — R3915 Urgency of urination: Secondary | ICD-10-CM | POA: Diagnosis not present

## 2019-02-25 DIAGNOSIS — K625 Hemorrhage of anus and rectum: Secondary | ICD-10-CM | POA: Diagnosis not present

## 2019-02-25 DIAGNOSIS — E282 Polycystic ovarian syndrome: Secondary | ICD-10-CM | POA: Diagnosis not present

## 2019-02-27 DIAGNOSIS — D509 Iron deficiency anemia, unspecified: Secondary | ICD-10-CM | POA: Diagnosis not present

## 2019-02-27 DIAGNOSIS — R3915 Urgency of urination: Secondary | ICD-10-CM | POA: Diagnosis not present

## 2019-02-28 ENCOUNTER — Encounter: Payer: Self-pay | Admitting: Gastroenterology

## 2019-03-22 ENCOUNTER — Ambulatory Visit: Payer: Medicare Other | Admitting: Obstetrics & Gynecology

## 2019-03-27 ENCOUNTER — Ambulatory Visit (INDEPENDENT_AMBULATORY_CARE_PROVIDER_SITE_OTHER): Payer: Medicare HMO | Admitting: Gastroenterology

## 2019-03-27 ENCOUNTER — Encounter: Payer: Self-pay | Admitting: Gastroenterology

## 2019-03-27 VITALS — BP 110/64 | HR 68 | Temp 97.7°F | Ht 64.5 in | Wt 170.8 lb

## 2019-03-27 DIAGNOSIS — Z01818 Encounter for other preprocedural examination: Secondary | ICD-10-CM

## 2019-03-27 DIAGNOSIS — K921 Melena: Secondary | ICD-10-CM | POA: Diagnosis not present

## 2019-03-27 DIAGNOSIS — D509 Iron deficiency anemia, unspecified: Secondary | ICD-10-CM | POA: Diagnosis not present

## 2019-03-27 MED ORDER — NA SULFATE-K SULFATE-MG SULF 17.5-3.13-1.6 GM/177ML PO SOLN: 1.0000 | Freq: Once | ORAL | 0 refills | Status: AC

## 2019-03-27 NOTE — Patient Instructions (Signed)
Your provider has requested that you go to the basement level for lab work before leaving today. Press "B" on the elevator. The lab is located at the first door on the left as you exit the elevator.  Due to recent changes in healthcare laws, you may see the results of your imaging and laboratory studies on MyChart before your provider has had a chance to review them.  We understand that in some cases there may be results that are confusing or concerning to you. Not all laboratory results come back in the same time frame and the provider may be waiting for multiple results in order to interpret others.  Please give [place] 48 hours in order for your provider to thoroughly review all the results before contacting the office for clarification of your results.    Start taking over the counter Miralax mixing 17 grams in 8 oz of water daily leading up to procedures to help with constipation.   You have been scheduled for an endoscopy and colonoscopy. Please follow the written instructions given to you at your visit today. Please pick up your prep supplies at the pharmacy within the next 1-3 days. If you use inhalers (even only as needed), please bring them with you on the day of your procedure.  Normal BMI (Body Mass Index- based on height and weight) is between 19 and 25. Your BMI today is Body mass index is 28.86 kg/m. Marland Kitchen Please consider follow up  regarding your BMI with your Primary Care Provider.  Thank you for choosing me and [facility].  [name]. Dagoberto Ligas., MD., Marval Regal

## 2019-03-27 NOTE — Progress Notes (Addendum)
History of Present Illness: This is a 39 year old female referred by Shanon Rosser, PA-C for the evaluation of iron deficiency and self limited hematochezia.  In addition for mother has a history of colon polyps.  She was diagnosed with iron deficiency anemia in January 2020 and has been treated for over a year.  Her anemia has improved however her ferritin remains low at 9, iron saturation 8%.  She has a history of menorrhagia which is recently improved.  She has an appointment with GYN for further evaluation.  Patient has problems with ongoing constipation and occasionally does have bowel cleanout.  She used milk of magnesia on one occasion and developed hematochezia which lasted about 3 days and then resolved.  She has a sensation of incomplete evacuation after most bowel movements. Denies weight loss, abdominal pain, diarrhea, change in stool caliber, melena, nausea, vomiting, dysphagia, reflux symptoms, chest pain.     No Known Allergies Outpatient Medications Prior to Visit  Medication Sig Dispense Refill  . ferrous sulfate 325 (65 FE) MG tablet Take 325 mg by mouth 2 (two) times daily with a meal.   0  . VITAMIN D, ERGOCALCIFEROL, PO Take by mouth daily.    . metFORMIN (GLUCOPHAGE) 500 MG tablet 11/10/18 Not started yet     No facility-administered medications prior to visit.   Past Medical History:  Diagnosis Date  . Anemia   . Anxiety   . Depression   . Headache(784.0)   . Polycystic ovaries 05/15/2016   Past Surgical History:  Procedure Laterality Date  . CESAREAN SECTION  2013   Social History   Socioeconomic History  . Marital status: Single    Spouse name: Not on file  . Number of children: 3  . Years of education: 52  . Highest education level: Not on file  Occupational History  . Occupation: Unemployed  Tobacco Use  . Smoking status: Current Every Day Smoker    Packs/day: 0.80    Types: Cigarettes    Last attempt to quit: 03/30/2011    Years since quitting:  7.9  . Smokeless tobacco: Never Used  Substance and Sexual Activity  . Alcohol use: Yes    Alcohol/week: 0.0 standard drinks    Comment: occasional  . Drug use: No  . Sexual activity: Yes  Other Topics Concern  . Not on file  Social History Narrative   Lives at home with three children.   Right-handed.   1 cup coffee per day.   Social Determinants of Health   Financial Resource Strain:   . Difficulty of Paying Living Expenses: Not on file  Food Insecurity:   . Worried About Charity fundraiser in the Last Year: Not on file  . Ran Out of Food in the Last Year: Not on file  Transportation Needs:   . Lack of Transportation (Medical): Not on file  . Lack of Transportation (Non-Medical): Not on file  Physical Activity:   . Days of Exercise per Week: Not on file  . Minutes of Exercise per Session: Not on file  Stress:   . Feeling of Stress : Not on file  Social Connections:   . Frequency of Communication with Friends and Family: Not on file  . Frequency of Social Gatherings with Friends and Family: Not on file  . Attends Religious Services: Not on file  . Active Member of Clubs or Organizations: Not on file  . Attends Archivist Meetings: Not on file  .  Marital Status: Not on file   Family History  Problem Relation Age of Onset  . Asthma Mother   . Rashes / Skin problems Mother   . Hypertension Mother   . Colon polyps Mother   . Hypertension Father   . Heart disease Father   . Breast cancer Paternal Aunt        Review of Systems: Pertinent positive and negative review of systems were noted in the above HPI section. All other review of systems were otherwise negative.    Physical Exam: General: Well developed, well nourished, no acute distress Head: Normocephalic and atraumatic Eyes:  sclerae anicteric, EOMI Ears: Normal auditory acuity Mouth: Not examined, mask on  Neck: Supple, no masses or thyromegaly Lungs: Clear throughout to auscultation Heart:  Regular rate and rhythm; no murmurs, rubs or bruits Abdomen: Soft, non tender and non distended. No masses, hepatosplenomegaly or hernias noted. Normal Bowel sounds Rectal: Deferred to colonoscopy  Musculoskeletal: Symmetrical with no gross deformities  Skin: No lesions on visible extremities Pulses:  Normal pulses noted Extremities: No clubbing, cyanosis, edema or deformities noted Neurological: Alert oriented x 4, grossly nonfocal Cervical Nodes:  No significant cervical adenopathy Inguinal Nodes: No significant inguinal adenopathy Psychological:  Alert and cooperative.  Anxious   Assessment and Recommendations:  1. Iron deficiency anemia, persistent. Hematochezia, self-limited.  Constipation with sensation of incomplete evacuation.  Iron deficiency likely related to menorrhagia.  GYN follow-up as planned.  Rule out colorectal neoplasms, AVMs, ulcer, celiac disease, etc.  Begin MiraLAX daily.  Increase daily fiber and water intake.  tTG IgA today.  Increase iron to twice daily with meals.  Hold iron for 5 days prior to colonoscopy.  Schedule colonoscopy and EGD to further evaluate persistent iron deficiency and hematochezia. The risks (including bleeding, perforation, infection, missed lesions, medication reactions and possible hospitalization or surgery if complications occur), benefits, and alternatives to colonoscopy with possible biopsy and possible polypectomy were discussed with the patient and they consent to proceed. The risks (including bleeding, perforation, infection, missed lesions, medication reactions and possible hospitalization or surgery if complications occur), benefits, and alternatives to endoscopy with possible biopsy and possible dilation were discussed with the patient and they consent to proceed.     cc: Shanon Rosser, PA-C [address] [place],  [place] [address]

## 2019-03-29 ENCOUNTER — Telehealth: Payer: Self-pay | Admitting: Gastroenterology

## 2019-03-29 NOTE — Telephone Encounter (Signed)
Patient is advised that she was to go to the lab the day of her office visit.  She did not understand that and she states she specifically asked and was told she could leave.  I apologized that this was not clearly communicated to her verbally, but was on her AVS.  She will go to the lab at her convenience.

## 2019-04-06 ENCOUNTER — Other Ambulatory Visit (INDEPENDENT_AMBULATORY_CARE_PROVIDER_SITE_OTHER): Payer: Medicare HMO

## 2019-04-06 DIAGNOSIS — K921 Melena: Secondary | ICD-10-CM

## 2019-04-06 DIAGNOSIS — D509 Iron deficiency anemia, unspecified: Secondary | ICD-10-CM | POA: Diagnosis not present

## 2019-04-06 LAB — IGA: IgA: 176 mg/dL (ref 68–378)

## 2019-04-07 LAB — TISSUE TRANSGLUTAMINASE, IGA: (tTG) Ab, IgA: 1 U/mL

## 2019-04-14 ENCOUNTER — Ambulatory Visit (INDEPENDENT_AMBULATORY_CARE_PROVIDER_SITE_OTHER): Payer: Medicare HMO

## 2019-04-14 ENCOUNTER — Other Ambulatory Visit: Payer: Self-pay | Admitting: Gastroenterology

## 2019-04-14 DIAGNOSIS — Z1159 Encounter for screening for other viral diseases: Secondary | ICD-10-CM

## 2019-04-15 LAB — SARS CORONAVIRUS 2 (TAT 6-24 HRS): SARS Coronavirus 2: NEGATIVE

## 2019-04-18 ENCOUNTER — Telehealth: Payer: Self-pay | Admitting: Gastroenterology

## 2019-04-18 ENCOUNTER — Ambulatory Visit (AMBULATORY_SURGERY_CENTER): Payer: Medicare HMO | Admitting: Gastroenterology

## 2019-04-18 ENCOUNTER — Encounter: Payer: Self-pay | Admitting: Gastroenterology

## 2019-04-18 ENCOUNTER — Other Ambulatory Visit: Payer: Self-pay

## 2019-04-18 VITALS — BP 116/78 | HR 78 | Temp 96.6°F | Resp 18 | Ht 64.5 in | Wt 170.0 lb

## 2019-04-18 DIAGNOSIS — Z8371 Family history of colonic polyps: Secondary | ICD-10-CM

## 2019-04-18 DIAGNOSIS — K2970 Gastritis, unspecified, without bleeding: Secondary | ICD-10-CM

## 2019-04-18 DIAGNOSIS — D509 Iron deficiency anemia, unspecified: Secondary | ICD-10-CM | POA: Diagnosis not present

## 2019-04-18 DIAGNOSIS — K921 Melena: Secondary | ICD-10-CM | POA: Diagnosis not present

## 2019-04-18 DIAGNOSIS — K2951 Unspecified chronic gastritis with bleeding: Secondary | ICD-10-CM | POA: Diagnosis not present

## 2019-04-18 DIAGNOSIS — K259 Gastric ulcer, unspecified as acute or chronic, without hemorrhage or perforation: Secondary | ICD-10-CM | POA: Diagnosis not present

## 2019-04-18 DIAGNOSIS — D125 Benign neoplasm of sigmoid colon: Secondary | ICD-10-CM | POA: Diagnosis not present

## 2019-04-18 DIAGNOSIS — K640 First degree hemorrhoids: Secondary | ICD-10-CM | POA: Diagnosis not present

## 2019-04-18 MED ORDER — SODIUM CHLORIDE 0.9 % IV SOLN: 500.0000 mL | Freq: Once | INTRAVENOUS | Status: DC

## 2019-04-18 NOTE — Op Note (Signed)
[facility] Patient Name: Jaclyn Macdonald Procedure Date: 04/18/2019 2:09 PM MRN: [id number] Endoscopist: Ladene Artist , MD Age: 39 Referring MD:  Date of Birth: 1980/04/14 Gender: Female Account #: [id number] Procedure:                Upper GI endoscopy Indications:              Unexplained iron deficiency anemia Medicines:                Monitored Anesthesia Care Procedure:                Pre-Anesthesia Assessment:                           - Prior to the procedure, a History and Physical                            was performed, and patient medications and                            allergies were reviewed. The patient's tolerance of                            previous anesthesia was also reviewed. The risks                            and benefits of the procedure and the sedation                            options and risks were discussed with the patient.                            All questions were answered, and informed consent                            was obtained. Prior Anticoagulants: The patient has                            taken no previous anticoagulant or antiplatelet                            agents. ASA Grade Assessment: II - A patient with                            mild systemic disease. After reviewing the risks                            and benefits, the patient was deemed in                            satisfactory condition to undergo the procedure.                           After obtaining informed consent, the endoscope was  passed under direct vision. Throughout the                            procedure, the patient's blood pressure, pulse, and                            oxygen saturations were monitored continuously. The                            Endoscope was introduced through the mouth, and                            advanced to the second part of duodenum. The upper                            GI endoscopy was  accomplished without difficulty.                            The patient tolerated the procedure well. Scope In: Scope Out: Findings:                 The examined esophagus was normal.                           A few localized small erosions with no bleeding and                            no stigmata of recent bleeding were found in the                            prepyloric region of the stomach. Biopsies were                            taken with a cold forceps for histology.                           Patchy mildly erythematous mucosa without bleeding                            was found in the entire examined stomach. Biopsies                            were taken with a cold forceps for histology.                           The exam of the stomach was otherwise normal.                           The duodenal bulb and second portion of the                            duodenum were normal. Complications:            No immediate complications. Estimated Blood Loss:     Estimated blood loss  was minimal. Impression:               - Normal esophagus.                           - Erosive gastropathy with no bleeding and no                            stigmata of recent bleeding. Biopsied.                           - Erythematous mucosa in the stomach. Biopsied.                           - Normal duodenal bulb and second portion of the                            duodenum. Recommendation:           - Patient has a contact number available for                            emergencies. The signs and symptoms of potential                            delayed complications were discussed with the                            patient. Return to normal activities tomorrow.                            Written discharge instructions were provided to the                            patient.                           - Resume previous diet.                           - Continue present medications.                            - Await pathology results. Ladene Artist, MD 04/18/2019 2:34:56 PM This report has been signed electronically.

## 2019-04-18 NOTE — Telephone Encounter (Signed)
Pt requested a call back to review findings at colonoscopy and EGD today. Attempted to return call however no answer and unable to leave a message as her voice mail was full.

## 2019-04-18 NOTE — Progress Notes (Signed)
Report given to PACU, vss 

## 2019-04-18 NOTE — Op Note (Signed)
[facility] Patient Name: Jaclyn Macdonald Procedure Date: 04/18/2019 2:09 PM MRN: [id number] Endoscopist: Ladene Artist , MD Age: 39 Referring MD:  Date of Birth: [birth year] Gender: Female Account #: [id number] Procedure:                Colonoscopy Indications:              Hematochezia, Unexplained iron deficiency anemia,                            Family history of colon polyps. Medicines:                Monitored Anesthesia Care Procedure:                Pre-Anesthesia Assessment:                           - Prior to the procedure, a History and Physical                            was performed, and patient medications and                            allergies were reviewed. The patient's tolerance of                            previous anesthesia was also reviewed. The risks                            and benefits of the procedure and the sedation                            options and risks were discussed with the patient.                            All questions were answered, and informed consent                            was obtained. Prior Anticoagulants: The patient has                            taken no previous anticoagulant or antiplatelet                            agents. ASA Grade Assessment: II - A patient with                            mild systemic disease. After reviewing the risks                            and benefits, the patient was deemed in                            satisfactory condition to undergo the procedure.  After obtaining informed consent, the colonoscope                            was passed under direct vision. Throughout the                            procedure, the patient's blood pressure, pulse, and                            oxygen saturations were monitored continuously. The                            Colonoscope was introduced through the anus and                            advanced to the the cecum,  identified by                            appendiceal orifice and ileocecal valve. The                            ileocecal valve, appendiceal orifice, and rectum                            were photographed. The quality of the bowel                            preparation was excellent. The colonoscopy was                            performed without difficulty. The patient tolerated                            the procedure well. Scope In: 2:13:02 PM Scope Out: 2:24:40 PM Scope Withdrawal Time: 0 hours 8 minutes 54 seconds  Total Procedure Duration: 0 hours 11 minutes 38 seconds  Findings:                 The perianal and digital rectal examinations were                            normal.                           A 12 mm polyp was found in the sigmoid colon. The                            polyp was pedunculated. The polyp was removed with                            a hot snare. Resection and retrieval were complete.                           Internal hemorrhoids were found during  retroflexion. The hemorrhoids were small and Grade                            I (internal hemorrhoids that do not prolapse).                           The exam was otherwise without abnormality on                            direct and retroflexion views. Complications:            No immediate complications. Estimated blood loss:                            None. Estimated Blood Loss:     Estimated blood loss: none. Impression:               - One 12 mm polyp in the sigmoid colon, removed                            with a hot snare. Resected and retrieved.                           - Internal hemorrhoids.                           - The examination was otherwise normal on direct                            and retroflexion views. Recommendation:           - Repeat colonoscopy, likely in 3 years, for                            surveillance based on pathology results.                            - Patient has a contact number available for                            emergencies. The signs and symptoms of potential                            delayed complications were discussed with the                            patient. Return to normal activities tomorrow.                            Written discharge instructions were provided to the                            patient.                           - Resume previous diet.                           -  Continue present medications.                           - Await pathology results.                           - No aspirin, ibuprofen, naproxen, or other                            non-steroidal anti-inflammatory drugs for 2 weeks                            after polyp removal. Ladene Artist, MD 04/18/2019 2:32:40 PM This report has been signed electronically.

## 2019-04-18 NOTE — Progress Notes (Signed)
Called to room to assist during endoscopic procedure.  Patient ID and intended procedure confirmed with present staff. Received instructions for my participation in the procedure from the performing physician.  

## 2019-04-18 NOTE — Progress Notes (Signed)
Pt's states no medical or surgical changes since previsit or office visit.  [place]

## 2019-04-18 NOTE — Patient Instructions (Signed)
YOU HAD AN ENDOSCOPIC PROCEDURE TODAY AT THE [facility]:   Refer to the procedure report that was given to you for any specific questions about what was found during the examination.  If the procedure report does not answer your questions, please call your gastroenterologist to clarify.  If you requested that your care partner not be given the details of your procedure findings, then the procedure report has been included in a sealed envelope for you to review at your convenience later.  YOU SHOULD EXPECT: Some feelings of bloating in the abdomen. Passage of more gas than usual.  Walking can help get rid of the air that was put into your GI tract during the procedure and reduce the bloating. If you had a lower endoscopy (such as a colonoscopy or flexible sigmoidoscopy) you may notice spotting of blood in your stool or on the toilet paper. If you underwent a bowel prep for your procedure, you may not have a normal bowel movement for a few days.  Please Note:  You might notice some irritation and congestion in your nose or some drainage.  This is from the oxygen used during your procedure.  There is no need for concern and it should clear up in a day or so.  SYMPTOMS TO REPORT IMMEDIATELY:   Following lower endoscopy (colonoscopy or flexible sigmoidoscopy):  Excessive amounts of blood in the stool  Significant tenderness or worsening of abdominal pains  Swelling of the abdomen that is new, acute  Fever of 100F or higher   Following upper endoscopy (EGD)  Vomiting of blood or coffee ground material  New chest pain or pain under the shoulder blades  Painful or persistently difficult swallowing  New shortness of breath  Fever of 100F or higher  Black, tarry-looking stools  For urgent or emergent issues, a gastroenterologist can be reached at any hour by calling [phone]. Do not use MyChart messaging for urgent concerns.    DIET:  We do recommend a small meal at first, but  then you may proceed to your regular diet.  Drink plenty of fluids but you should avoid alcoholic beverages for 24 hours.  ACTIVITY:  You should plan to take it easy for the rest of today and you should NOT DRIVE or use heavy machinery until tomorrow (because of the sedation medicines used during the test).    FOLLOW UP: Our staff will call the number listed on your records 48-72 hours following your procedure to check on you and address any questions or concerns that you may have regarding the information given to you following your procedure. If we do not reach you, we will leave a message.  We will attempt to reach you two times.  During this call, we will ask if you have developed any symptoms of COVID 19. If you develop any symptoms (ie: fever, flu-like symptoms, shortness of breath, cough etc.) before then, please call [phone].  If you test positive for Covid 19 in the 2 weeks post procedure, please call and report this information to us.    If any biopsies were taken you will be contacted by phone or by letter within the next 1-3 weeks.  Please call us at [phone] if you have not heard about the biopsies in 3 weeks.    SIGNATURES/CONFIDENTIALITY: You and/or your care partner have signed paperwork which will be entered into your electronic medical record.  These signatures attest to the fact that that the information above on   your After Visit Summary has been reviewed and is understood.  Full responsibility of the confidentiality of this discharge information lies with you and/or your care-partner. 

## 2019-04-20 ENCOUNTER — Telehealth: Payer: Self-pay

## 2019-04-20 NOTE — Telephone Encounter (Signed)
  Follow up Call-  Call back number 04/18/2019  Post procedure Call Back phone  # [phone]  Permission to leave phone message Yes  Some recent data might be hidden     Patient questions:  Do you have a fever, pain , or abdominal swelling? No. Pain Score  0 *  Have you tolerated food without any problems? Yes.    Have you been able to return to your normal activities? Yes.    Do you have any questions about your discharge instructions: Diet   No. Medications  No. Follow up visit  No.  Do you have questions or concerns about your Care? No.  Actions: * If pain score is 4 or above: 1. No action needed, pain <4.Have you developed a fever since your procedure? no  2.   Have you had an respiratory symptoms (SOB or cough) since your procedure? no  3.   Have you tested positive for COVID 19 since your procedure no  4.   Have you had any family members/close contacts diagnosed with the COVID 19 since your procedure?  no   If yes to any of these questions please route to Joylene John, RN and Alphonsa Gin, Therapist, sports.

## 2019-04-30 ENCOUNTER — Encounter: Payer: Self-pay | Admitting: Gastroenterology

## 2019-12-07 ENCOUNTER — Ambulatory Visit (INDEPENDENT_AMBULATORY_CARE_PROVIDER_SITE_OTHER): Payer: Medicare HMO | Admitting: Gastroenterology

## 2019-12-07 ENCOUNTER — Encounter: Payer: Self-pay | Admitting: Gastroenterology

## 2019-12-07 VITALS — BP 114/76 | HR 96 | Ht 64.0 in | Wt 164.5 lb

## 2019-12-07 DIAGNOSIS — K625 Hemorrhage of anus and rectum: Secondary | ICD-10-CM

## 2019-12-07 DIAGNOSIS — K648 Other hemorrhoids: Secondary | ICD-10-CM | POA: Diagnosis not present

## 2019-12-07 DIAGNOSIS — K5904 Chronic idiopathic constipation: Secondary | ICD-10-CM

## 2019-12-07 NOTE — Progress Notes (Signed)
    History of Present Illness: This is a 39 year old female who relates frequent problems with constipation and intermittent bright red rectal bleeding with bowel movements.  She noted on 2 occasions after eating peanuts she had red rectal bleeding with a bowel movement.  She started MiraLAX but has been reluctant to remain on MiraLAX and is attempted to increase her dietary fiber intake without complete success in relieving her constipation.  Colonoscopy as below.  Colonoscopy 04/2019 - One 12 mm polyp in the sigmoid colon, removed with a hot snare. Resected and retrieved. (tubular adenoma)  - Internal hemorrhoids. - The examination was otherwise normal on direct and retroflexion views.  EGD 04/2019 - Normal esophagus. - Erosive gastropathy with no bleeding and no stigmata of recent bleeding. Biopsied. - Erythematous mucosa in the stomach. Biopsied. - Normal duodenal bulb and second portion of the duodenum.  Current Medications, Allergies, Past Medical History, Past Surgical History, Family History and Social History were reviewed in [employer] record.   Physical Exam: General: Well developed, well nourished, no acute distress Head: Normocephalic and atraumatic Eyes:  sclerae anicteric, EOMI Ears: Normal auditory acuity Mouth: Not examined, mask on during Covid-19 pandemic Lungs: Clear throughout to auscultation Heart: Regular rate and rhythm; no murmurs, rubs or bruits Abdomen: Soft, non tender and non distended. No masses, hepatosplenomegaly or hernias noted. Normal Bowel sounds Musculoskeletal: Symmetrical with no gross deformities  Pulses:  Normal pulses noted Extremities: No clubbing, cyanosis, edema or deformities noted Neurological: Alert oriented x 4, grossly nonfocal Psychological:  Alert and cooperative. Normal mood and affect   Assessment and Recommendations:  1.  Rectal bleeding and constipation.  Intermittent internal hemorrhoidal bleeding.   Patient reassured that this was the source of bleeding.  Long-term optimal management of constipation should help hemorrhoidal symptoms.  Begin Benefiber daily.  High-fiber diet long-term.  Adequate daily water intake.  MiraLAX daily if constipation persists.  Preparation H suppositories PR daily as needed for hemorrhoidal symptoms.  Difficulties with constipation and hemorrhoidal bleeding persist consider Trulance, Linzess and hemorrhoidal banding as options.  Follow-up with PCP.  GI follow-up as needed.  2.  Personal history of 12 mm adenomatous colon polyp.  A 3-year interval surveillance colonoscopy is recommended in March 2024.  3.  History of iron deficiency anemia.  Ongoing follow-up and management with her PCP.

## 2019-12-07 NOTE — Patient Instructions (Signed)
Start over the counter Miralax mixing 17 grams in 8 oz of water daily.  Start over the [employer] daily.   You can use over the counter preperation H suppositories daily as needed for rectal bleeding and hemorrhoid symptoms.   Call our office if your symptoms continue.   Thank you for choosing me and [facility].  [name]. Dagoberto Ligas., MD., [facility] Regal

## 2021-01-13 IMAGING — MG MM DIGITAL DIAGNOSTIC BILAT W/ TOMO W/ CAD
8 series · 8 of 24 positions shown · non-contrast
Comparison: None.

CLINICAL DATA: 38-year-old presenting with an episode of pain in
the LEFT breast the behind the LEFT nipple which lasted for
approximately 3 days and occurred approximately 1 month ago. The
pain has subsequently resolved and has not recurred.

This is the patient's initial baseline mammogram. Family history of
breast cancer in a paternal aunt.
EXAM:
DIGITAL DIAGNOSTIC BILATERAL MAMMOGRAM WITH CAD AND TOMO

[R MLO synth-2D]
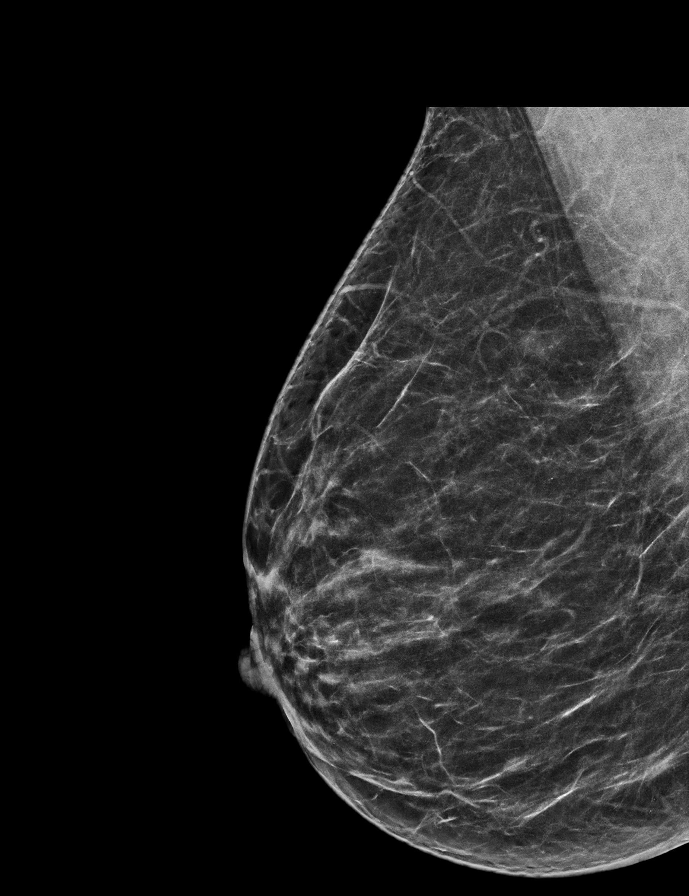

[L CC synth-2D]
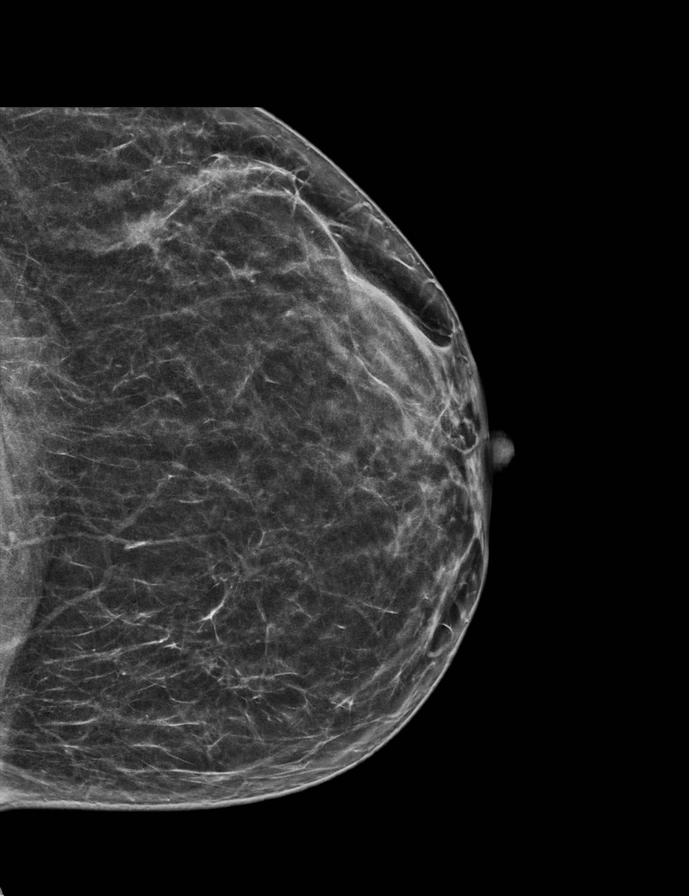

[L MLO synth-2D]
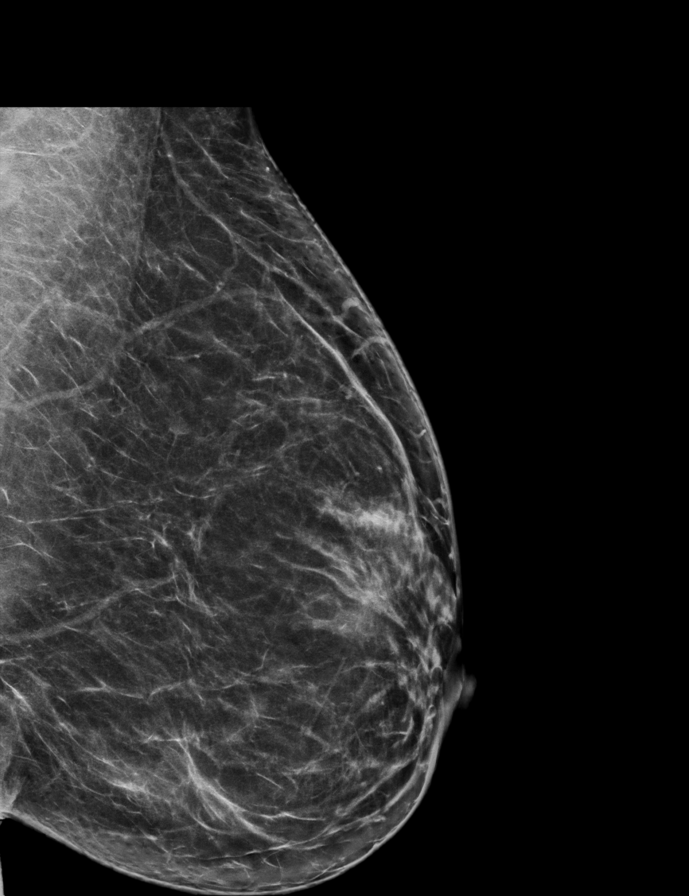

[R CC synth-2D]
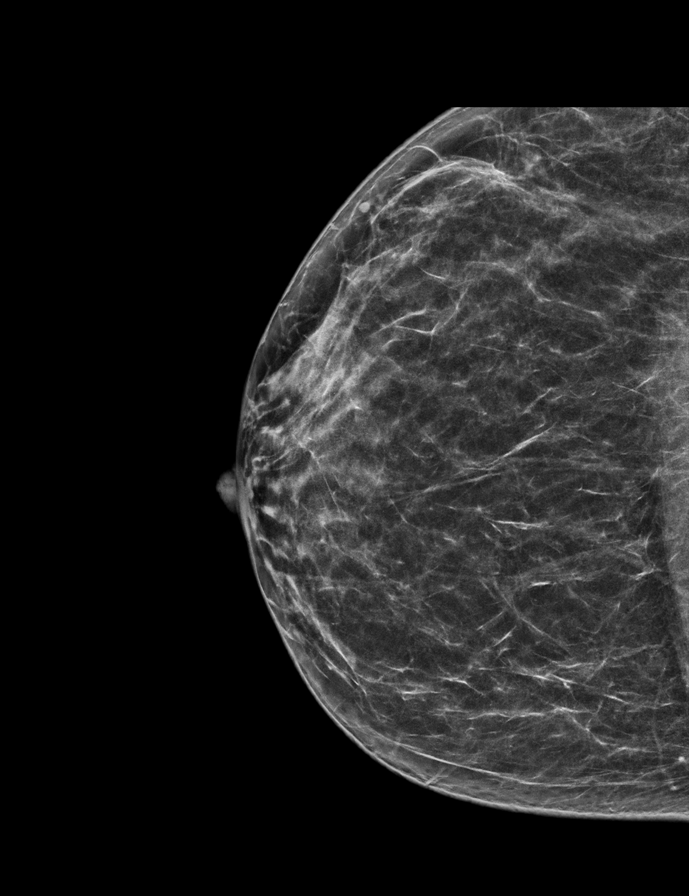

[L CC tomo · tomo slice 29/58.0]
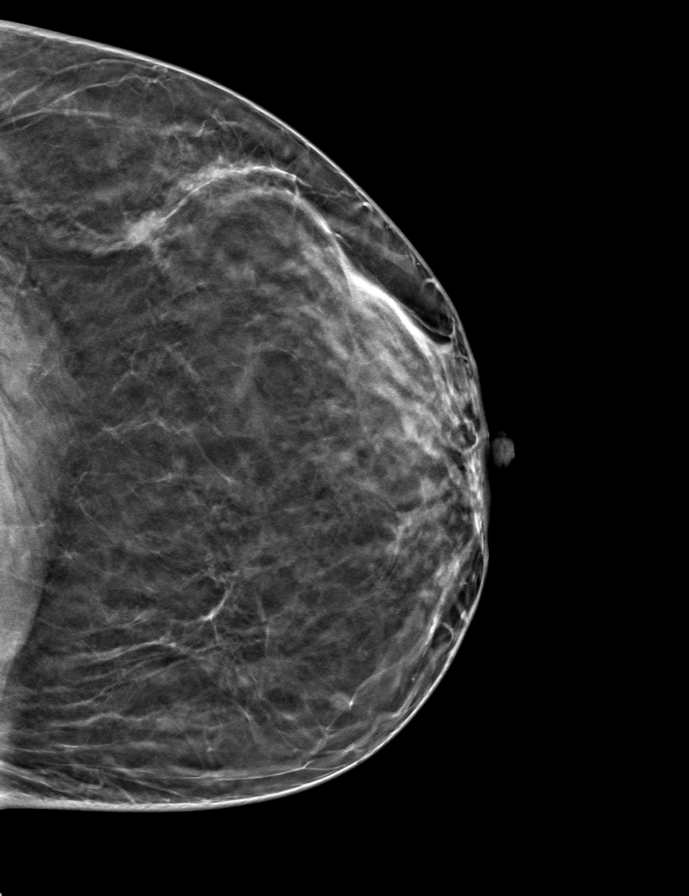

[R MLO tomo · tomo slice 30/59.0]
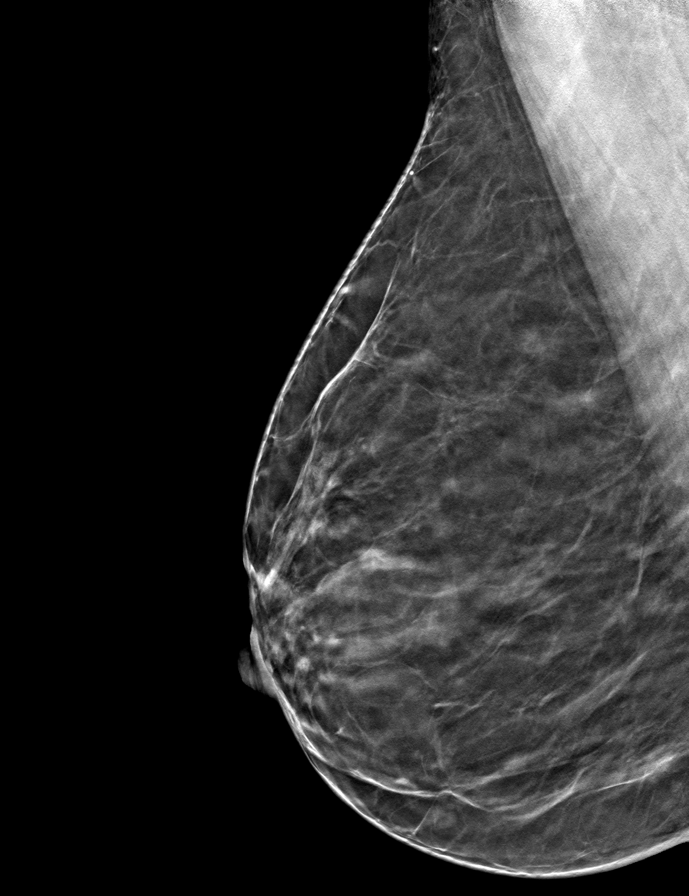

[L MLO tomo · tomo slice 37/72.0]
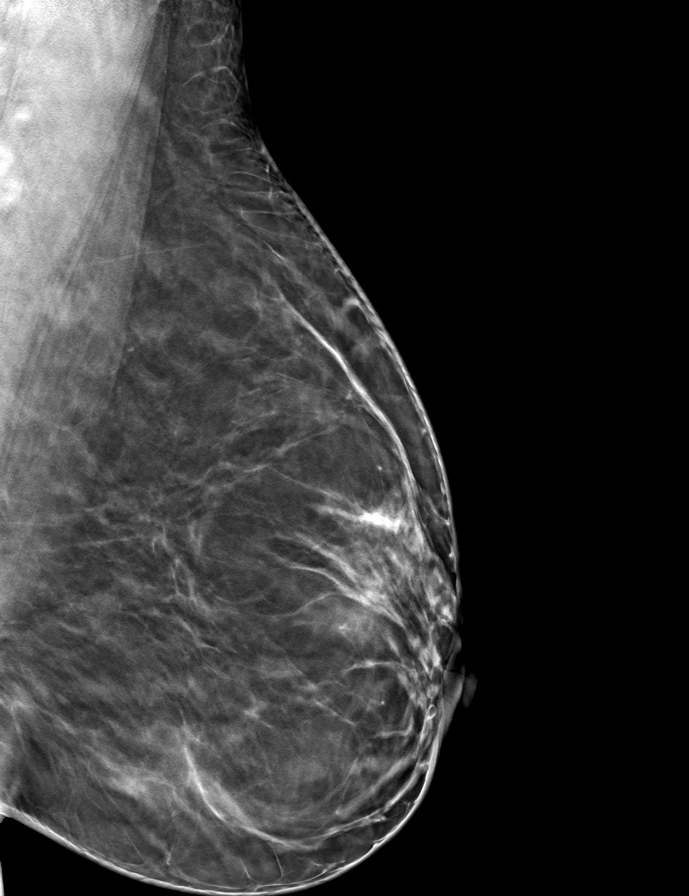

[R CC tomo · tomo slice 31/60.0]
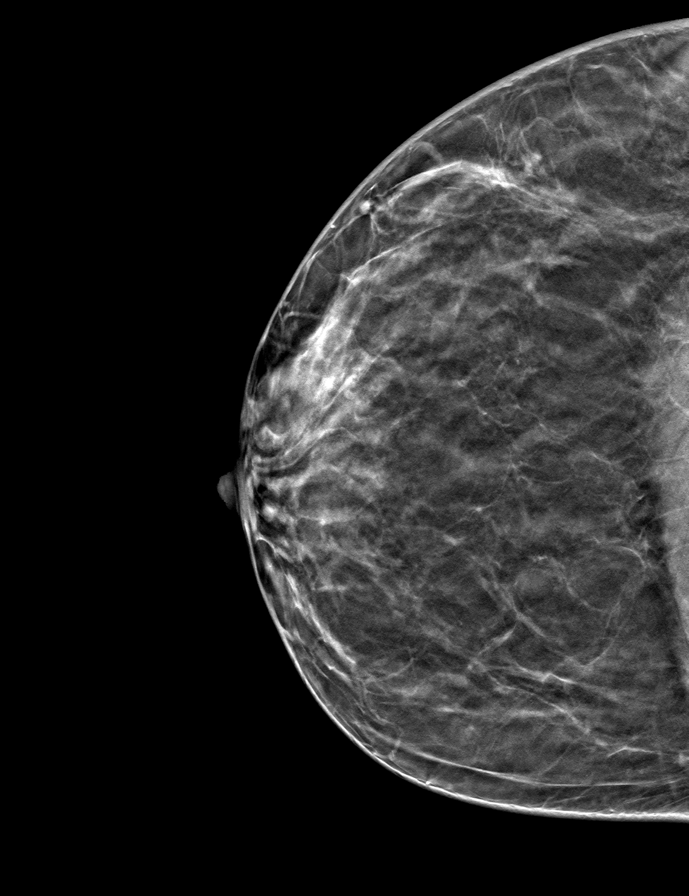

[8 of 24 positions shown; findings below may reference images not displayed]

ACR Breast Density Category b: There are scattered areas of
fibroglandular density.
FINDINGS: Tomosynthesis and synthesized full field CC and MLO views of both
breasts were obtained.

No findings suspicious for malignancy in either breast.

Mammographic images were processed with CAD.
IMPRESSION: No mammographic evidence of malignancy involving either breast.

RECOMMENDATION:
Screening mammogram at age 41 unless there are persistent or
intervening clinical concerns. (Code:[id number]-[id number])

Strategies for alleviating breast pain including decreasing caffeine
intake, vitamin-E supplementation, and avoidance of tight
compression brassieres, including underwire brassieres, were
discussed with the patient.

I have discussed the findings and recommendations with the patient.
If applicable, a reminder letter will be sent to the patient
regarding the next appointment.

BI-RADS CATEGORY  1: Negative.

## 2021-04-03 ENCOUNTER — Other Ambulatory Visit: Payer: Self-pay | Admitting: *Deleted

## 2021-04-03 DIAGNOSIS — Z1231 Encounter for screening mammogram for malignant neoplasm of breast: Secondary | ICD-10-CM

## 2021-04-07 ENCOUNTER — Ambulatory Visit
Admission: RE | Admit: 2021-04-07 | Discharge: 2021-04-07 | Disposition: A | Discharge status: Home / Self Care | Payer: 59 | Source: Ambulatory Visit | Attending: *Deleted | Admitting: *Deleted

## 2021-04-07 DIAGNOSIS — Z1231 Encounter for screening mammogram for malignant neoplasm of breast: Secondary | ICD-10-CM

## 2021-05-08 ENCOUNTER — Ambulatory Visit
Admission: RE | Admit: 2021-05-08 | Discharge: 2021-05-08 | Disposition: A | Discharge status: Home / Self Care | Payer: 59 | Source: Ambulatory Visit | Attending: *Deleted | Admitting: *Deleted

## 2021-06-11 ENCOUNTER — Encounter: Payer: Self-pay | Admitting: Gastroenterology

## 2021-06-11 ENCOUNTER — Ambulatory Visit (INDEPENDENT_AMBULATORY_CARE_PROVIDER_SITE_OTHER): Payer: 59 | Admitting: Gastroenterology

## 2021-06-11 VITALS — BP 118/66 | HR 105 | Ht 64.0 in | Wt 171.8 lb

## 2021-06-11 DIAGNOSIS — Z86010 Personal history of colonic polyps: Secondary | ICD-10-CM

## 2021-06-11 DIAGNOSIS — K921 Melena: Secondary | ICD-10-CM

## 2021-06-11 NOTE — Progress Notes (Signed)
? ? ?  Assessment   ?  ?Hematochezia due to internal hemorrhoids.  ?Mild constipation ?Personal history of adenomatous colon polyp.  ?3.   History of erosive gastritis ?4.   IDA ? ?Recommendations  ?  ?Rectal care instructions, Prep H supp PR qd prn and increase daily water and fiber intake. Contact us if symptoms are persistent. ?Colonoscopy March 2024 ? ? ?HPI  ?  ?This is a 41 year old female with one episode of painless rectal bleeding.  She relates red blood and few small clots in the commode yesterday.  Following that she had a small amount of blood per rectum later in the day.  No recurrent bleeding since.  She has chronic mild constipation and tries to avoid straining.  She generally moves her bowels daily but often misses a day. ? ? ?Colonoscopy 04/2019 ?- One 12 mm polyp in the sigmoid colon, removed with a hot snare. Resected and retrieved. ?- Internal hemorrhoids. ?- The examination was otherwise normal on direct and retroflexion views. ? ?Labs / Imaging  ?  ?No recent GI data ? ?Current Medications, Allergies, Past Medical History, Past Surgical History, Family History and Social History were reviewed in [employer] record. ? ? ?Physical Exam: ?General: Well developed, well nourished, no acute distress ?Head: Normocephalic and atraumatic ?Eyes: Sclerae anicteric, EOMI ?Ears: Normal auditory acuity ?Mouth: Not examined, mask on during Covid-19 pandemic ?Lungs: Clear throughout to auscultation ?Heart: Regular rate and rhythm; no murmurs, rubs or bruits ?Abdomen: Soft, non tender and non distended. No masses, hepatosplenomegaly or hernias noted. Normal Bowel sounds ?Rectal: No lesions, no tenderness, heme negative brown stool ?Musculoskeletal: Symmetrical with no gross deformities  ?Pulses:  Normal pulses noted ?Extremities: No clubbing, cyanosis, edema or deformities noted ?Neurological: Alert oriented x 4, grossly nonfocal ?Psychological:  Alert and cooperative. Anxious  ? ? ?[name]. Fuller Plan, MD 06/11/2021, 1:55 PM  ?

## 2021-06-11 NOTE — Patient Instructions (Signed)
You can use over the counter preparation H suppositories as needed.  ? ?RECTAL CARE INSTRUCTIONS: ? ?1. Sitz Baths twice a day for 10 minutes each. ?2. Thoroughly clean and dry the rectum. ?3. Put Tucks pad against the rectum at night. ?4. Clean the rectum with Balenol lotion after each bowel movement. ? ?Increase fiber intake in your diet and start an over the counter fiber supplement such as Benefiber daily. ? ?Contact us if your symptoms do no improve.  ? ?The [facility] providers would like to encourage you to use [facility] to communicate with providers for non-urgent requests or questions.  Due to long hold times on the telephone, sending your provider a message by [facility] may be a faster and more efficient way to get a response.  Please allow 48 business hours for a response.  Please remember that this is for non-urgent requests.  ? ?Thank you for choosing me and [facility]. ? ?Malcolm T. Dagoberto Ligas., MD., Marval Regal ? ? ?

## 2021-06-12 ENCOUNTER — Telehealth: Payer: Self-pay | Admitting: Gastroenterology

## 2021-06-12 NOTE — Telephone Encounter (Signed)
Patient called in with complaints of rectal bleeding this morning with her bowel movement. She was seen yesterday for OV with Dr. Fuller Plan & was evaluated for internal hemorrhoids. Patient was told in office to try prep h suppositories daily as needed, which she hasn't tried yet. Patient states she will go to pharmacy today to buy both suppositories & tucks pad. She was able to do a sitz bath. She has been advised to try the interventions given to her in office for a few days & then call back on Monday if symptoms haven't improved. Patient verbalized understanding & no further questions.  ?

## 2021-06-12 NOTE — Telephone Encounter (Signed)
We received a call from patient requesting to speak with nurse. Per patient, would like to update [place] on her symptoms. Patient is also wondering if we recommend a colonoscopy? Please advise. ?

## 2021-08-19 ENCOUNTER — Encounter: Payer: Self-pay | Admitting: Internal Medicine

## 2021-08-19 ENCOUNTER — Ambulatory Visit: Payer: 59 | Attending: Internal Medicine | Admitting: Internal Medicine

## 2021-08-19 VITALS — BP 130/88 | HR 95 | Temp 98.3°F | Resp 15 | Ht 65.5 in | Wt 170.0 lb

## 2021-08-19 DIAGNOSIS — L659 Nonscarring hair loss, unspecified: Secondary | ICD-10-CM

## 2021-08-19 DIAGNOSIS — E282 Polycystic ovarian syndrome: Secondary | ICD-10-CM

## 2021-08-19 DIAGNOSIS — Z7689 Persons encountering health services in other specified circumstances: Secondary | ICD-10-CM | POA: Diagnosis not present

## 2021-08-19 DIAGNOSIS — F411 Generalized anxiety disorder: Secondary | ICD-10-CM

## 2021-08-19 DIAGNOSIS — F17200 Nicotine dependence, unspecified, uncomplicated: Secondary | ICD-10-CM

## 2021-08-19 DIAGNOSIS — N926 Irregular menstruation, unspecified: Secondary | ICD-10-CM | POA: Diagnosis not present

## 2021-08-19 DIAGNOSIS — F17210 Nicotine dependence, cigarettes, uncomplicated: Secondary | ICD-10-CM

## 2021-08-19 DIAGNOSIS — E663 Overweight: Secondary | ICD-10-CM

## 2021-08-19 DIAGNOSIS — R030 Elevated blood-pressure reading, without diagnosis of hypertension: Secondary | ICD-10-CM

## 2021-08-19 DIAGNOSIS — F321 Major depressive disorder, single episode, moderate: Secondary | ICD-10-CM

## 2021-08-19 MED ORDER — NICOTINE 21 MG/24HR TD PT24: 21.0000 mg | MEDICATED_PATCH | Freq: Every day | TRANSDERMAL | 1 refills | Status: DC

## 2021-08-19 NOTE — Progress Notes (Signed)
Patient ID: Jaclyn Macdonald, female    DOB: 11-16-1980  MRN: [id number]  CC: Establish Care   Subjective: Jaclyn Macdonald is a 41 y.o. female who presents for new pt visit Her concerns today include:  Hx of tob dep, anemia, colon polyps, PCOS, anx/dep, internal hemorrhoids  Previous PCP was [employer], [place] at a [facility].  Last seen 2021 Pt confirms medical hx listed above  Sees endocrinologist Dr. Buddy Duty with [facility] for PCOS and a thyroid issue  Started on Metformin 500 XR mg TID 12/2020 for PCOS.  Only made it to BID and noticed hair loss/thinning at the back of the head.  She stopped taking Metformin because of this.  No new patches.    Also told thyroid "looked bumping." Thyroid blood panel came back abn.  Reports having had recent thyroid US; no nodules or goiter.  She is not sure of her diagnosis and is not on medication for thyroid. -thinks she is perimenopausal and request hormone levels check.  Menstrual cycle can occur any where from 44-50 days apart since 2021. Prior to 2021 cycles were 29-31 days. Menses usually painful/cramps but not last cycle.  Has acne and hair under chin.  No problems getting pregnant in past -has had problems losing wgh: was doing cardio and strength training but nothing recently She has tried changing eating habits as well.  Blood pressure noted to be elevated today.  No previous issues with blood pressure.  She eats a low-salt diet.  Quit smoking in 12/2019. Restarted 05/2021.  Smoking 1/2 pk a day.  Trying to quit again.  Not sure what it will take. Not sure how she quit last time.  She just woke up and decided not to do it any more.  Does not want Chantix for fear of mood swings.   Positive PHQ-9 and GAD-7 screening: Gives history of depression and anxiety.  Feels over whelm most of the times.  Can not concentrate.  She was seeing a counselor before COVID pandemic at [facility].  Was not on med.  Feels she would benefit from seeing a counselor again but  struggles with changes in mental health providers when there is staff turnover and has to see someone different.  She has experienced this several times   Was on Zoloft, Lexapro and others.  Did not like how it make her feel. Patient Active Problem List   Diagnosis Date Noted   Arm paresthesia, right 11/10/2018   Weakness 11/10/2018   Neck pain 11/10/2018     Current Outpatient Medications on File Prior to Visit  Medication Sig Dispense Refill   ferrous sulfate 325 (65 FE) MG tablet Take 325 mg by mouth as needed (Only when her cycle will take only one tablet).   0   VITAMIN D, ERGOCALCIFEROL, PO Take 1 tablet by mouth daily.      albuterol (VENTOLIN HFA) 108 (90 Base) MCG/ACT inhaler as needed. (Patient not taking: Reported on 08/19/2021)     metFORMIN (GLUCOPHAGE-XR) 500 MG 24 hr tablet Take 500 mg by mouth 3 (three) times daily. (Patient not taking: Reported on 08/19/2021)     No current facility-administered medications on file prior to visit.    No Known Allergies  Social History   Socioeconomic History   Marital status: Single    Spouse name: Not on file   Number of children: 3   Years of education: 14   Highest education level: Not on file  Occupational History   Occupation:  Unemployed  Tobacco Use   Smoking status: Every Day    Packs/day: 0.50    Types: Cigarettes   Smokeless tobacco: Never  Vaping Use   Vaping Use: Never used  Substance and Sexual Activity   Alcohol use: Yes    Alcohol/week: 0.0 standard drinks of alcohol    Comment: occasional   Drug use: No   Sexual activity: Not Currently  Other Topics Concern   Not on file  Social History Narrative   Lives at home with three children.   Right-handed.   1 cup coffee per day.   Social Determinants of Health   Financial Resource Strain: Not on file  Food Insecurity: Not on file  Transportation Needs: Not on file  Physical Activity: Not on file  Stress: Not on file  Social Connections: Not on file   Intimate Partner Violence: Not on file    Family History  Problem Relation Age of Onset   Asthma Mother    Rashes / Skin problems Mother    Hypertension Mother    Colon polyps Mother    Hypertension Father    Heart disease Father    Breast cancer Maternal Aunt    Breast cancer Maternal Aunt    Breast cancer Maternal Aunt    Breast cancer Paternal Aunt    Breast cancer Paternal Aunt    Breast cancer Other    Colon cancer Neg Hx    Esophageal cancer Neg Hx    Stomach cancer Neg Hx    Rectal cancer Neg Hx     Past Surgical History:  Procedure Laterality Date   CESAREAN SECTION  2013    ROS: Review of Systems Negative except as stated above  PHYSICAL EXAM: BP 130/88 (BP Location: Left Arm, Patient Position: Sitting, Cuff Size: Normal)   Pulse 95   Temp 98.3 F (36.8 C) (Oral)   Resp 15   Ht 5' 5.5" (1.664 m)   Wt 170 lb (77.1 kg)   LMP 08/17/2021   SpO2 100%   BMI 27.86 kg/m   Wt Readings from Last 3 Encounters:  08/19/21 170 lb (77.1 kg)  06/11/21 171 lb 12.8 oz (77.9 kg)  12/07/19 164 lb 8 oz (74.6 kg)  Repeat blood pressure was 138/90  Physical Exam  General appearance - alert, well appearing, middle age AAF and in no distress Mental status - pt tearful at times, talkative and tangential Neck - thyroid does not appear enlarged.  No nodules appreciated Chest - clear to auscultation, no wheezes, rales or rhonchi, symmetric air entry Heart - normal rate, regular rhythm, normal S1, S2, no murmurs, rubs, clicks or gallops Extremities - peripheral pulses normal, no pedal edema, no clubbing or cyanosis     08/19/2021    8:57 AM  Depression screen PHQ 2/9  Decreased Interest 3  Down, Depressed, Hopeless 2  PHQ - 2 Score 5  Altered sleeping 3  Tired, decreased energy 2  Change in appetite 3  Feeling bad or failure about yourself  3  Trouble concentrating 3  Moving slowly or fidgety/restless 0  Suicidal thoughts 0  PHQ-9 Score 19      08/19/2021     8:58 AM  GAD 7 : Generalized Anxiety Score  Nervous, Anxious, on Edge 3  Control/stop worrying 3  Worry too much - different things 3  Trouble relaxing 3  Restless 2  Easily annoyed or irritable 2  Afraid - awful might happen 3  Total GAD 7 Score 19  No data to display          ASSESSMENT AND PLAN: 1. Encounter to establish care   2. PCOS (polycystic ovarian syndrome) 3. Irregular menses Advised patient that PCOS itself can cause irregular menses with skipped cycles due to anovulatory cycles.  Metformin helps but if she is having S.E from medication then other options need to be explored. Rather young to be perimenopausal but will check hormone levels.  Request info from her endocrinologist in regards to the thyroid issue - TSH+T4F+T3Free - Prolactin - FSH/LH  4. Tobacco dependence Pt is current smoker. Patient advised to quit smoking. Discussed health risks associated with smoking. Pt ready to give trail of quitting.   Discussed methods to help quit including quitting cold [place], use of NRT, Chantix.  Pt willing to try: Nicotine replacement therapy with nicotine patches.  Discussed how to use the patches on the stepdown approach. _3_ Minutes spent on counseling. F/U: Assess progress on subsequent visit  - nicotine (NICODERM CQ - DOSED IN MG/24 HOURS) 21 mg/24hr patch; Place 1 patch (21 mg total) onto the skin daily.  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 GAD (generalized anxiety disorder) Patient with moderate depression and anxiety.  She is not wanting to be placed on medication at this time.  Prefers to try counseling first.  I have  provided her with information on behavioral health resources in the [place] area  including [facility] and [facility].  7. Hair loss We will check thyroid level.  8. Over weight Patient advised to eliminate sugary drinks from the diet, cut back on portion sizes especially  of white carbohydrates, eat more white lean meat like chicken [place] and seafood instead of beef or pork and incorporate fresh fruits and vegetables into the diet daily. Encouraged her to get in some form of moderate intensity exercise at least 5 days a week for 30 minutes - CBC - Comprehensive metabolic panel - Lipid panel - Hemoglobin A1c  9. Elevated blood-pressure reading without diagnosis of hypertension Repeat blood pressure is better but still above normal of 120/80.  DASH diet discussed and encouraged.  We will recheck blood pressure on subsequent visit.    Patient was given the opportunity to ask questions.  Patient verbalized understanding of the plan and was able to repeat key elements of the plan.   This documentation was completed using Radio producer.  Any transcriptional errors are unintentional.  No orders of the defined types were placed in this encounter.    Requested Prescriptions    No prescriptions requested or ordered in this encounter    No follow-ups on file.  Karle Plumber, MD, FACP

## 2021-08-19 NOTE — Patient Instructions (Addendum)
Try calling [facility] to schedule with a counselor and psychiatrist - [phone]  Healthy Eating Following a healthy eating pattern may help you to achieve and maintain a healthy body weight, reduce the risk of chronic disease, and live a long and productive life. It is important to follow a healthy eating pattern at an appropriate calorie level for your body. Your nutritional needs should be met primarily through food by choosing a variety of nutrient-rich foods. What are tips for following this plan? Reading food labels Read labels and choose the following: Reduced or low sodium. Juices with 100% fruit juice. Foods with low saturated fats and high polyunsaturated and monounsaturated fats. Foods with whole grains, such as whole wheat, cracked wheat, brown rice, and wild rice. Whole grains that are fortified with folic acid. This is recommended for women who are pregnant or who want to become pregnant. Read labels and avoid the following: Foods with a lot of added sugars. These include foods that contain brown sugar, corn sweetener, corn syrup, dextrose, fructose, glucose, high-fructose corn syrup, honey, invert sugar, lactose, malt syrup, maltose, molasses, raw sugar, sucrose, trehalose, or turbinado sugar. Do not eat more than the following amounts of added sugar per day: 6 teaspoons (25 g) for women. 9 teaspoons (38 g) for men. Foods that contain processed or refined starches and grains. Refined grain products, such as white flour, degermed cornmeal, white bread, and white rice. Shopping Choose nutrient-rich snacks, such as vegetables, whole fruits, and nuts. Avoid high-calorie and high-sugar snacks, such as potato chips, fruit snacks, and candy. Use oil-based dressings and spreads on foods instead of solid fats such as butter, stick margarine, or cream cheese. Limit pre-made sauces, mixes, and "instant" products such as flavored rice, instant noodles, and  ready-made pasta. Try more plant-protein sources, such as tofu, tempeh, black beans, edamame, lentils, nuts, and seeds. Explore eating plans such as the Mediterranean diet or vegetarian diet. Cooking Use oil to saut or stir-fry foods instead of solid fats such as butter, stick margarine, or lard. Try baking, boiling, grilling, or broiling instead of frying. Remove the fatty part of meats before cooking. Steam vegetables in water or broth. Meal planning  At meals, imagine dividing your plate into fourths: One-half of your plate is fruits and vegetables. One-fourth of your plate is whole grains. One-fourth of your plate is protein, especially lean meats, poultry, eggs, tofu, beans, or nuts. Include low-fat dairy as part of your daily diet. Lifestyle Choose healthy options in all settings, including home, work, school, restaurants, or stores. Prepare your food safely: Wash your hands after handling raw meats. Keep food preparation surfaces clean by regularly washing with hot, soapy water. Keep raw meats separate from ready-to-eat foods, such as fruits and vegetables. Cook seafood, meat, poultry, and eggs to the recommended internal temperature. Store foods at safe temperatures. In general: Keep cold foods at 33F (4.4C) or below. Keep hot foods at 133F (60C) or above. Keep your freezer at [facility] (-17.8C) or below. Foods are no longer safe to eat when they have been between the temperatures of 40-133F (4.4-60C) for more than 2 hours. What foods should I eat? Fruits Aim to eat 2 cup-equivalents of fresh, canned (in natural juice), or frozen fruits each day. Examples of 1 cup-equivalent of fruit include 1 small apple, 8 large strawberries, 1 cup canned fruit,  cup dried fruit, or 1 cup 100% juice. Vegetables Aim to eat 2-3 cup-equivalents of fresh and frozen vegetables each day, including different  varieties and colors. Examples of 1 cup-equivalent of vegetables include 2 medium  carrots, 2 cups raw, leafy greens, 1 cup chopped vegetable (raw or cooked), or 1 medium baked potato. Grains Aim to eat 6 ounce-equivalents of whole grains each day. Examples of 1 ounce-equivalent of grains include 1 slice of bread, 1 cup ready-to-eat cereal, 3 cups popcorn, or  cup cooked rice, pasta, or cereal. Meats and other proteins Aim to eat 5-6 ounce-equivalents of protein each day. Examples of 1 ounce-equivalent of protein include 1 egg, 1/2 cup nuts or seeds, or 1 tablespoon (16 g) peanut butter. A cut of meat or fish that is the size of a deck of cards is about 3-4 ounce-equivalents. Of the protein you eat each week, try to have at least 8 ounces come from seafood. This includes salmon, trout, herring, and anchovies. Dairy Aim to eat 3 cup-equivalents of fat-free or low-fat dairy each day. Examples of 1 cup-equivalent of dairy include 1 cup (240 mL) milk, 8 ounces (250 g) yogurt, 1 ounces (44 g) natural cheese, or 1 cup (240 mL) fortified soy milk. Fats and oils Aim for about 5 teaspoons (21 g) per day. Choose monounsaturated fats, such as canola and olive oils, avocados, peanut butter, and most nuts, or polyunsaturated fats, such as sunflower, corn, and soybean oils, walnuts, pine nuts, sesame seeds, sunflower seeds, and flaxseed. Beverages Aim for six 8-oz glasses of water per day. Limit coffee to three to five 8-oz cups per day. Limit caffeinated beverages that have added calories, such as soda and energy drinks. Limit alcohol intake to no more than 1 drink a day for nonpregnant women and 2 drinks a day for men. One drink equals 12 oz of beer (355 mL), 5 oz of wine (148 mL), or 1 oz of hard liquor (44 mL). Seasoning and other foods Avoid adding excess amounts of salt to your foods. Try flavoring foods with herbs and spices instead of salt. Avoid adding sugar to foods. Try using oil-based dressings, sauces, and spreads instead of solid fats. This information is based on general [place]  nutrition guidelines. For more information, visit [employer].es. Exact amounts may vary based on your nutrition needs. Summary A healthy eating plan may help you to maintain a healthy weight, reduce the risk of chronic diseases, and stay active throughout your life. Plan your meals. Make sure you eat the right portions of a variety of nutrient-rich foods. Try baking, boiling, grilling, or broiling instead of frying. Choose healthy options in all settings, including home, work, school, restaurants, or stores. This information is not intended to replace advice given to you by your health care provider. Make sure you discuss any questions you have with your health care provider.

## 2021-08-19 NOTE — Progress Notes (Signed)
Notice alopecia with Metformin Hormone levels feel off Anemic  Insomnia and anxiety

## 2021-08-20 LAB — CBC
Hematocrit: 40 % (ref 34.0–46.6)
Hemoglobin: 12.9 g/dL (ref 11.1–15.9)
MCH: 26 pg — ABNORMAL LOW (ref 26.6–33.0)
MCHC: 32.3 g/dL (ref 31.5–35.7)
MCV: 81 fL (ref 79–97)
Platelets: 372 10*3/uL (ref 150–450)
RBC: 4.97 x10E6/uL (ref 3.77–5.28)
RDW: 17.2 % — ABNORMAL HIGH (ref 11.7–15.4)
WBC: 5.6 10*3/uL (ref 3.4–10.8)

## 2021-08-20 LAB — COMPREHENSIVE METABOLIC PANEL
ALT: 21 IU/L (ref 0–32)
AST: 21 IU/L (ref 0–40)
Albumin/Globulin Ratio: 1.5 (ref 1.2–2.2)
Albumin: 4.4 g/dL (ref 3.9–4.9)
Alkaline Phosphatase: 96 IU/L (ref 44–121)
BUN/Creatinine Ratio: 11 (ref 9–23)
BUN: 8 mg/dL (ref 6–24)
Bilirubin Total: 0.3 mg/dL (ref 0.0–1.2)
CO2: 23 mmol/L (ref 20–29)
Calcium: 9.7 mg/dL (ref 8.7–10.2)
Chloride: 103 mmol/L (ref 96–106)
Creatinine, Ser: 0.72 mg/dL (ref 0.57–1.00)
Globulin, Total: 3 g/dL (ref 1.5–4.5)
Glucose: 90 mg/dL (ref 70–99)
Potassium: 4.3 mmol/L (ref 3.5–5.2)
Sodium: 139 mmol/L (ref 134–144)
Total Protein: 7.4 g/dL (ref 6.0–8.5)
eGFR: 108 mL/min/{1.73_m2} (ref >59)

## 2021-08-20 LAB — PROLACTIN: Prolactin: 17.5 ng/mL (ref 4.8–23.3)

## 2021-08-20 LAB — LIPID PANEL
Chol/HDL Ratio: 3.1 ratio (ref 0.0–4.4)
Cholesterol, Total: 180 mg/dL (ref 100–199)
HDL: 58 mg/dL (ref >39)
LDL Chol Calc (NIH): 109 mg/dL — ABNORMAL HIGH (ref 0–99)
Triglycerides: 67 mg/dL (ref 0–149)
VLDL Cholesterol Cal: 13 mg/dL (ref 5–40)

## 2021-08-20 LAB — HEMOGLOBIN A1C
Est. average glucose Bld gHb Est-mCnc: 105 mg/dL
Hgb A1c MFr Bld: 5.3 % (ref 4.8–5.6)

## 2021-08-20 LAB — FSH/LH
FSH: 8.3 m[IU]/mL
LH: 4.8 m[IU]/mL

## 2021-08-20 LAB — TSH+T4F+T3FREE
Free T4: 1.45 ng/dL (ref 0.82–1.77)
T3, Free: 3.6 pg/mL (ref 2.0–4.4)
TSH: 0.593 u[IU]/mL (ref 0.450–4.500)

## 2021-09-29 ENCOUNTER — Ambulatory Visit: Payer: 59 | Attending: Internal Medicine | Admitting: Internal Medicine

## 2021-09-29 ENCOUNTER — Encounter: Payer: Self-pay | Admitting: Internal Medicine

## 2021-09-29 VITALS — BP 125/85 | HR 104 | Ht 64.5 in | Wt 162.4 lb

## 2021-09-29 DIAGNOSIS — E559 Vitamin D deficiency, unspecified: Secondary | ICD-10-CM

## 2021-09-29 DIAGNOSIS — F17200 Nicotine dependence, unspecified, uncomplicated: Secondary | ICD-10-CM

## 2021-09-29 DIAGNOSIS — R200 Anesthesia of skin: Secondary | ICD-10-CM | POA: Diagnosis not present

## 2021-09-29 DIAGNOSIS — R030 Elevated blood-pressure reading, without diagnosis of hypertension: Secondary | ICD-10-CM | POA: Diagnosis not present

## 2021-09-29 NOTE — Progress Notes (Signed)
Has questions of nicotine patches.  Numbing & tingling in both hands & fingers.

## 2021-09-29 NOTE — Progress Notes (Signed)
Patient ID: Jaclyn Macdonald, female    DOB: Aug 29, 1980  MRN: [id number]  CC: Nicotine Dependence   Subjective: Jaclyn Macdonald is a 41 y.o. female who presents for 6 wks f/u recheck on BP and tob dep Her concerns today include:  Hx of tob dep, anemia, colon polyps, PCOS, anx/dep, internal hemorrhoids  Tob dep: desired to quit.  Started on patches on last visit.  Did not start patches as yet.  Tried to do cold [place] and lasted 4 days.  Concern that nicotine constricts blood vessels. Gets tingling and numbness in fingers intermittently worse at nights.  Has been happening for a while since COVID pandemic  Several yrs ago, LT hand went out while driving. Saw neurologist, Dr. Krista Blue for symptoms. Had nl EMG. Would like to have vit D level checked.  Hx of vit D def  BP slightly elev on last visit Limits salt in foods.  Feels she does everything she needs to do with her eating habits Has device at home.  She checks BP at home was 118/78. Feels anxiety plays a role  Patient Active Problem List   Diagnosis Date Noted   Arm paresthesia, right 11/10/2018   Weakness 11/10/2018   Neck pain 11/10/2018     Current Outpatient Medications on File Prior to Visit  Medication Sig Dispense Refill   albuterol (VENTOLIN HFA) 108 (90 Base) MCG/ACT inhaler as needed.     ferrous sulfate 325 (65 FE) MG tablet Take 325 mg by mouth as needed (Only when her cycle will take only one tablet).   0   metFORMIN (GLUCOPHAGE-XR) 500 MG 24 hr tablet Take 500 mg by mouth 3 (three) times daily.     nicotine (NICODERM CQ - DOSED IN MG/24 HOURS) 21 mg/24hr patch Place 1 patch (21 mg total) onto the skin daily. 28 patch 1   VITAMIN D, ERGOCALCIFEROL, PO Take 1 tablet by mouth daily.      No current facility-administered medications on file prior to visit.    No Known Allergies  Social History   Socioeconomic History   Marital status: Single    Spouse name: Not on file   Number of children: 3   Years of  education: 14   Highest education level: Not on file  Occupational History   Occupation: Unemployed  Tobacco Use   Smoking status: Every Day    Packs/day: 0.50    Types: Cigarettes   Smokeless tobacco: Never  Vaping Use   Vaping Use: Never used  Substance and Sexual Activity   Alcohol use: Yes    Alcohol/week: 0.0 standard drinks of alcohol    Comment: occasional   Drug use: No   Sexual activity: Not Currently  Other Topics Concern   Not on file  Social History Narrative   Lives at home with three children.   Right-handed.   1 cup coffee per day.   Social Determinants of Health   Financial Resource Strain: Not on file  Food Insecurity: Not on file  Transportation Needs: Not on file  Physical Activity: Not on file  Stress: Not on file  Social Connections: Not on file  Intimate Partner Violence: Not on file    Family History  Problem Relation Age of Onset   Asthma Mother    Rashes / Skin problems Mother    Hypertension Mother    Colon polyps Mother    Hypertension Father    Heart disease Father    Breast cancer Maternal Aunt  Breast cancer Maternal Aunt    Breast cancer Maternal Aunt    Breast cancer Paternal Aunt    Breast cancer Paternal Aunt    Breast cancer Other    Colon cancer Neg Hx    Esophageal cancer Neg Hx    Stomach cancer Neg Hx    Rectal cancer Neg Hx     Past Surgical History:  Procedure Laterality Date   CESAREAN SECTION  2013    ROS: Review of Systems Negative except as stated above  PHYSICAL EXAM: BP 125/85   Pulse (!) 104   Ht 5' 4.5" (1.638 m)   Wt 162 lb 6.4 oz (73.7 kg)   LMP 09/13/2021   SpO2 98%   BMI 27.45 kg/m   Physical Exam BP 130/76 General appearance - alert, well appearing, middle age AAF and in no distress Mental status - normal mood, behavior, speech, dress, motor activity, and thought processes Chest - clear to auscultation, no wheezes, rales or rhonchi, symmetric air entry Heart - normal rate, regular  rhythm, normal S1, S2, no murmurs, rubs, clicks or gallops Neurological - grip 5/5 BL, no wasting of the intrinsic muscles of the hands noted.  Gross sensation in both hands normal.      Latest Ref Rng & Units 08/19/2021   10:20 AM  CMP  Glucose 70 - 99 mg/dL 90   BUN 6 - 24 mg/dL 8   Creatinine 0.57 - 1.00 mg/dL 0.72   Sodium 134 - 144 mmol/L 139   Potassium 3.5 - 5.2 mmol/L 4.3   Chloride 96 - 106 mmol/L 103   CO2 20 - 29 mmol/L 23   Calcium 8.7 - 10.2 mg/dL 9.7   Total Protein 6.0 - 8.5 g/dL 7.4   Total Bilirubin 0.0 - 1.2 mg/dL 0.3   Alkaline Phos 44 - 121 IU/L 96   AST 0 - 40 IU/L 21   ALT 0 - 32 IU/L 21    Lipid Panel     Component Value Date/Time   CHOL 180 08/19/2021 1020   TRIG 67 08/19/2021 1020   HDL 58 08/19/2021 1020   CHOLHDL 3.1 08/19/2021 1020   LDLCALC 109 (H) 08/19/2021 1020    CBC    Component Value Date/Time   WBC 5.6 08/19/2021 1020   RBC 4.97 08/19/2021 1020   HGB 12.9 08/19/2021 1020   HCT 40.0 08/19/2021 1020   PLT 372 08/19/2021 1020   MCV 81 08/19/2021 1020   MCH 26.0 (L) 08/19/2021 1020   MCHC 32.3 08/19/2021 1020   RDW 17.2 (H) 08/19/2021 1020    ASSESSMENT AND PLAN:  1. Numbness in both hands Normal EMG done by neurology a few years ago for these symptoms.  We will check B12 and folate levels. - Folate - Vitamin B12  2. Vitamin D deficiency - VITAMIN D 25 Hydroxy (Vit-D Deficiency, Fractures)  3. Tobacco dependence Encouraged her to go ahead and start the nicotine patches to help decrease her cravings and withdrawal symptoms.  4. Elevated blood-pressure reading without diagnosis of hypertension Levels still borderline today but patient reports good levels at home.  Encouraged her to continue low-salt diet.  Will not start medication at this time.   Patient was given the opportunity to ask questions.  Patient verbalized understanding of the plan and was able to repeat key elements of the plan.   This documentation was  completed using Radio producer.  Any transcriptional errors are unintentional.  Orders Placed This Encounter  Procedures   Folate   Vitamin B12   VITAMIN D 25 Hydroxy (Vit-D Deficiency, Fractures)     Requested Prescriptions    No prescriptions requested or ordered in this encounter    Return in about 4 months (around 01/29/2022).  Karle Plumber, MD, FACP

## 2021-09-30 LAB — FOLATE: Folate: 9.7 ng/mL (ref >3.0)

## 2021-09-30 LAB — VITAMIN B12: Vitamin B-12: 602 pg/mL (ref 232–1245)

## 2021-09-30 LAB — VITAMIN D 25 HYDROXY (VIT D DEFICIENCY, FRACTURES): Vit D, 25-Hydroxy: 28.3 ng/mL — ABNORMAL LOW (ref 30.0–100.0)

## 2021-12-17 ENCOUNTER — Ambulatory Visit: Payer: Self-pay | Admitting: *Deleted

## 2021-12-17 NOTE — Telephone Encounter (Signed)
Pt called for a sooner appt she needed to follow up after seeing her thyroid Dr and going to UC/ pt stated she has lost 17lbs and is not working out / everyday she is losing weight and is concerned    Reason for Disposition  [1] Continued weight loss AND [2] after medical evaluation by doctor (or NP/PA)  Answer Assessment - Initial Assessment Questions 1. MAIN CONCERN: "What is your main concern today?"     Unintentional weight loss 2. WEIGHT LOSS: "How much weight have you lost?"  (e.g., lbs., kgs.)  "Over what period of time have you lost this weight?"  (e.g., number of days, weeks, months, years)     163 to 155 in 4 days 3. BASELINE WEIGHT: "What is your baseline or normal weight?" (e.g., "How much do you usually weigh?")     163  down to 155 in 4 days 4. CAUSE: "What do you think is causing the weight loss?" (e.g., depression, anxiety, medicine side effect, pain, trouble swallowing, substance or alcohol use problem, eating disorder)     Unsure 5. PRIOR EVALUATION: "Have you been evaluated by a doctor for your weight loss?" If Yes, ask "When was your last visit?" "What did your doctor (or NP/PA) tell you about the possible cause?"     See 11/21/21 6. HEART FAILURE TREATMENT: "Do you have heart failure?" If Yes, ask: "Have you taken new or extra water pills (diuretics) recently?" (e.g., furosemide; bumetanide). "What is your target weight?"      7. OTHER SYMPTOMS: "Do you have any other symptoms?" (e.g., anxiety or depression, blood in stool, breathing difficulty, diarrhea, fever, trouble swallowing)     Anxiety regarding losing weight  Protocols used: Weight Loss - Unintended-A-AH

## 2021-12-17 NOTE — Telephone Encounter (Signed)
  Chief Complaint: Weight Loss Symptoms: lost 8 lbs in 4 days.. Seen 11/21/21 in UC for work up, states all negative. No longer with "Foamy" diarrhea. Has F/U appt 01/2022, insisted on being seen sooner. Has loss of appetite. Frequency: Since July Pertinent Negatives: Patient denies pain, vomiting, diarrhea Disposition: '[]'$ ED /'[]'$ Urgent Care (no appt availability in office) / '[x]'$ Appointment(In office/virtual)/ '[]'$  [place] [facility]/ '[]'$ Home Care/ '[]'$ Refused Recommended Disposition /'[]'$ [place] [facility]/ '[]'$  Follow-up with PCP Additional Notes: Rescheduled for 12/31/21 per pt's request.

## 2021-12-31 ENCOUNTER — Ambulatory Visit: Payer: 59 | Attending: Physician Assistant | Admitting: Physician Assistant

## 2021-12-31 VITALS — BP 119/81 | HR 72 | Temp 98.0°F | Ht 64.0 in | Wt 159.0 lb

## 2021-12-31 DIAGNOSIS — Z87891 Personal history of nicotine dependence: Secondary | ICD-10-CM | POA: Diagnosis not present

## 2021-12-31 DIAGNOSIS — K5900 Constipation, unspecified: Secondary | ICD-10-CM

## 2021-12-31 DIAGNOSIS — R634 Abnormal weight loss: Secondary | ICD-10-CM

## 2021-12-31 MED ORDER — HYDROCORTISONE ACETATE 25 MG RE SUPP: 25.0000 mg | Freq: Two times a day (BID) | RECTAL | 0 refills | Status: DC

## 2021-12-31 MED ORDER — POLYETHYLENE GLYCOL 3350 17 GM/SCOOP PO POWD: 17.0000 g | Freq: Two times a day (BID) | ORAL | 1 refills | Status: AC | PRN

## 2021-12-31 NOTE — Progress Notes (Signed)
Patient ID: Jaclyn Macdonald, female   DOB: Feb 04, 1980, 41 y.o.   MRN: [id number]   Jaclyn Macdonald, is a 41 y.o. female  GMW:[id number]  YQI:[id number]  DOB - [birth year]  Chief Complaint  Patient presents with   Weight Loss    Unintentional weight loss.  Diarrhea (clear foam) episode on 11/21/2021. Burping up bubbles.  Afraid to eat & drink.        Subjective:   Jaclyn Macdonald is a 41 y.o. female here today ffor continued concern about weight loss.  11 pound weight loss since July 2023 in [place] is documented.  She says she has never been able to lose weight no matter how hard she has tried.  Seen by Dr Buddy Duty for PCOS and started on metformin in May but "it caused my hair to fall out," so she quit taking it.  She is VERY concerned that she may have cancer of some sort.  She has had extensive bloodwork since July including but not limited to:  CMP, CBC, lipids, full thyroid panel, PRL, FSH/LH, A1C, folate, B12, vitamin D all essentially normal except vitamin D 28.3.  She saw Dr Buddy Duty again about 1 month ago and showed me her lab results including: neg for celiac antibodies, testosterone, full thyroid panel and antibodies, cortisol levels, CBC, CMP-all unremarkable.  She is peding testing for addison's/cortisol challenge.    Constipation for several days and some blood from hemorrhoid.    She tells me of problems relating abck to 10 yrs ago with cysts on her ovaries (dx PCOS).    Taking probiotic since August.  Eating like normal.  She has also tried limiting sodium, sugar, fast food then adding it back in when it did not seem to make a difference.  She is concerned about "frothy diarrhea" that occurred in October that was self-limiting and had neg stool pathology.  This symptom resolved after about 3 days and has not returned.  She has seen GI within the last year  No problems updated.  ALLERGIES: No Known Allergies  PAST MEDICAL HISTORY: Past Medical History:  Diagnosis Date    Anemia    Anxiety    Depression    Headache(784.0)    Kidney stone    Polycystic ovaries 05/15/2016   Thyroid condition     MEDICATIONS AT HOME: Prior to Admission medications   Medication Sig Start Date End Date Taking? Authorizing Provider  albuterol (VENTOLIN HFA) 108 (90 Base) MCG/ACT inhaler as needed.   Yes [provider]  ferrous sulfate 325 (65 FE) MG tablet Take 325 mg by mouth as needed (Only when her cycle will take only one tablet).  09/05/14  Yes [provider]  hydrocortisone (ANUSOL-HC) 25 MG suppository Place 1 suppository (25 mg total) rectally 2 (two) times daily. 12/31/21  Yes Psalm Arman M, PA-C  nicotine (NICODERM CQ - DOSED IN MG/24 HOURS) 21 mg/24hr patch Place 1 patch (21 mg total) onto the skin daily. 08/19/21  Yes Ladell Pier, MD  polyethylene glycol powder (GLYCOLAX/MIRALAX) 17 GM/SCOOP powder Take 17 g by mouth 2 (two) times daily as needed. 12/31/21  Yes Argentina Donovan, PA-C  VITAMIN D, ERGOCALCIFEROL, PO Take 1 tablet by mouth daily.    Yes [provider]  metFORMIN (GLUCOPHAGE-XR) 500 MG 24 hr tablet Take 500 mg by mouth 3 (three) times daily. Patient not taking: Reported on 12/31/2021 06/04/21   [provider]    ROS: Neg HEENT Neg resp Neg cardiac Neg GI Neg  GU Neg MS Neg psych Neg neuro  Objective:   Vitals:   12/31/21 1531  BP: 119/81  Pulse: 72  Temp: 98 F (36.7 C)  TempSrc: Oral  SpO2: 99%  Weight: 159 lb (72.1 kg)  Height: '5\' 4"'$  (1.626 m)   Exam General appearance : Awake, alert, not in any distress. Speech Clear. Not toxic looking HEENT: Atraumatic and Normocephalic Neck: Supple, no JVD. No cervical lymphadenopathy.  Chest: Good air entry bilaterally, CTAB.  No rales/rhonchi/wheezing CVS: S1 S2 regular, no murmurs.  Extremities: B/L Lower Ext shows no edema, both legs are warm to touch Neurology: Awake alert, and oriented X 3, CN II-XII intact, Non focal Skin: No Rash  Data  Review Lab Results  Component Value Date   HGBA1C 5.3 08/19/2021    Assessment & Plan   1. History of smoking - DG Chest 2 View; Future  2. Weight loss 11 pounds since 08/2021 but only 3 of those pounds since august  3. Constipation, unspecified constipation type - Sedimentation Rate - hydrocortisone (ANUSOL-HC) 25 MG suppository; Place 1 suppository (25 mg total) rectally 2 (two) times daily.  Dispense: 12 suppository; Refill: 0 - polyethylene glycol powder (GLYCOLAX/MIRALAX) 17 GM/SCOOP powder; Take 17 g by mouth 2 (two) times daily as needed.  Dispense: 3350 g; Refill: 1 At this point, I believe worried -well.  I have offered reassurance as of now given all the testing she has had up until now.    Spent more than 40 mins face to face-additional time reviewing epic labs and notes in addition to reviewing recent labs by Dr Buddy Duty.   Return in about 6 weeks (around 02/11/2022) for PCP for ongoing issues.  The patient was given clear instructions to go to ER or return to medical center if symptoms don't improve, worsen or new problems develop. The patient verbalized understanding. The patient was told to call to get lab results if they haven't heard anything in the next week.      Freeman Caldron, PA-C [facility] [place], [place]   12/31/2021, 4:40 PM

## 2022-01-01 LAB — SEDIMENTATION RATE: Sed Rate: 9 mm/hr (ref 0–32)

## 2022-01-05 ENCOUNTER — Ambulatory Visit
Admission: RE | Admit: 2022-01-05 | Discharge: 2022-01-05 | Disposition: A | Discharge status: Home / Self Care | Payer: 59 | Source: Ambulatory Visit | Attending: Physician Assistant | Admitting: Physician Assistant

## 2022-01-05 DIAGNOSIS — Z87891 Personal history of nicotine dependence: Secondary | ICD-10-CM

## 2022-01-29 ENCOUNTER — Ambulatory Visit: Payer: 59 | Admitting: Physician Assistant

## 2022-02-09 ENCOUNTER — Encounter: Payer: Self-pay | Admitting: Gastroenterology

## 2022-02-09 ENCOUNTER — Ambulatory Visit (INDEPENDENT_AMBULATORY_CARE_PROVIDER_SITE_OTHER): Payer: 59 | Admitting: Gastroenterology

## 2022-02-09 VITALS — BP 114/82 | HR 84 | Ht 64.0 in | Wt 162.0 lb

## 2022-02-09 DIAGNOSIS — K921 Melena: Secondary | ICD-10-CM

## 2022-02-09 DIAGNOSIS — K648 Other hemorrhoids: Secondary | ICD-10-CM | POA: Diagnosis not present

## 2022-02-09 MED ORDER — NA SULFATE-K SULFATE-MG SULF 17.5-3.13-1.6 GM/177ML PO SOLN: 1.0000 | Freq: Once | ORAL | 0 refills | Status: AC

## 2022-02-09 NOTE — Patient Instructions (Signed)
You have been scheduled for a colonoscopy. Please follow written instructions given to you at your visit today.  Please pick up your prep supplies at the pharmacy within the next 1-3 days. If you use inhalers (even only as needed), please bring them with you on the day of your procedure.  The [facility] providers would like to encourage you to use MYCHART to communicate with providers for non-urgent requests or questions.  Due to long hold times on the telephone, sending your provider a message by MYCHART may be a faster and more efficient way to get a response.  Please allow 48 business hours for a response.  Please remember that this is for non-urgent requests.   Due to recent changes in healthcare laws, you may see the results of your imaging and laboratory studies on MyChart before your provider has had a chance to review them.  We understand that in some cases there may be results that are confusing or concerning to you. Not all laboratory results come back in the same time frame and the provider may be waiting for multiple results in order to interpret others.  Please give us 48 hours in order for your provider to thoroughly review all the results before contacting the office for clarification of your results.   Thank you for choosing me and  Gastroenterology.  Malcolm T. Stark, Jr., MD., FACG  

## 2022-02-09 NOTE — Progress Notes (Signed)
    Assessment     Acute hematochezia with lower abdominal/rectal pain, resolved. R/O a self limited colitis - infection, ischemia Infrequent small-volume painless hematochezia due to internal hemorrhoids History of iron deficiency anemia Personal history of a 12 mm tubular adenomatous colon polyp   Recommendations    Schedule colonoscopy for further evaluation of acute symptoms and polyp surveillance. The risks (including bleeding, perforation, infection, missed lesions, medication reactions and possible hospitalization or surgery if complications occur), benefits, and alternatives to colonoscopy with possible biopsy and possible polypectomy were discussed with the patient and they consent to proceed.     HPI    This is a 42 year old female with hematochezia and lower abdominal pain.  She relates several days in early January with mild lower abdominal and rectal pain/cramping associated with bright red blood per rectum.  This was associated with urgency and tenesmus.  All her acute symptoms resolved and her bowel pattern has returned to normal.  She has not noted any rectal bleeding since.  She has internal hemorrhoids and infrequently notes small volume painless hematochezia that has responded to Preparation H supp.  Colonoscopy Mar 2021 - One 12 mm polyp in the sigmoid colon, removed with a hot snare. Resected and retrieved.  - Internal hemorrhoids. - The examination was otherwise normal on direct and retroflexion views. Path: tubular adenoma  EGD Mar 2021 - Normal esophagus. - Erosive gastropathy with no bleeding and no stigmata of recent bleeding. Biopsied. - Erythematous mucosa in the stomach. Biopsied. - Normal duodenal bulb and second portion of the duodenum. Path: mild chronic gastritis, H pylori negative   Labs / Imaging       Latest Ref Rng & Units 08/19/2021   10:20 AM  Hepatic Function  Total Protein 6.0 - 8.5 g/dL 7.4   Albumin 3.9 - 4.9 g/dL 4.4   AST 0 - 40  IU/L 21   ALT 0 - 32 IU/L 21   Alk Phosphatase 44 - 121 IU/L 96   Total Bilirubin 0.0 - 1.2 mg/dL 0.3        Latest Ref Rng & Units 08/19/2021   10:20 AM  CBC  WBC 3.4 - 10.8 x10E3/uL 5.6   Hemoglobin 11.1 - 15.9 g/dL 12.9   Hematocrit 34.0 - 46.6 % 40.0   Platelets 150 - 450 x10E3/uL 372    Current Medications, Allergies, Past Medical History, Past Surgical History, Family History and Social History were reviewed in [employer] record.   Physical Exam: General: Well developed, well nourished, no acute distress Head: Normocephalic and atraumatic Eyes: Sclerae anicteric, EOMI Ears: Normal auditory acuity Mouth: No deformities or lesions noted Lungs: Clear throughout to auscultation Heart: Regular rate and rhythm; No murmurs, rubs or bruits Abdomen: Soft, non tender and non distended. No masses, hepatosplenomegaly or hernias noted. Normal Bowel sounds Rectal: Deferred to colonoscopy Musculoskeletal: Symmetrical with no gross deformities  Pulses:  Normal pulses noted Extremities: No edema or deformities noted Neurological: Alert oriented x 4, grossly nonfocal Psychological:  Alert and cooperative. Normal mood and affect   Jo-Ann Johanning T. Fuller Plan, MD 02/09/2022, 10:23 AM

## 2022-02-10 ENCOUNTER — Encounter: Payer: Self-pay | Admitting: Internal Medicine

## 2022-02-10 ENCOUNTER — Ambulatory Visit: Payer: 59 | Attending: Internal Medicine | Admitting: Nurse Practitioner

## 2022-02-10 VITALS — BP 114/68 | HR 83 | Temp 98.4°F | Ht 64.0 in | Wt 161.0 lb

## 2022-02-10 DIAGNOSIS — R102 Pelvic and perineal pain: Secondary | ICD-10-CM

## 2022-02-10 NOTE — Progress Notes (Signed)
Assessment & Plan:  Jaclyn Macdonald was seen today for follow-up.  Diagnoses and all orders for this visit:  Pelvic pain -     Ambulatory referral to Gynecology    Patient has been counseled on age-appropriate routine health concerns for screening and prevention. These are reviewed and up-to-date. Referrals have been placed accordingly. Immunizations are up-to-date or declined.    Subjective:   Chief Complaint  Patient presents with   Follow-up   HPI Jaclyn Macdonald 42 y.o. female presents to office today initially for follow-up to a GI issue she was seen for in November however she has recently seen gastroenterology as of yesterday so today the not focus was on GU concerns.    She notes lower pelvic pain which is intermittent and alternates from the left to the right side.  She has associated right breast pain with the lower pelvic pain.  Last menstrual cycle was December 27 through January 1.  She is concerned that her bleeding was very heavy on the last day and is usually light. She has a pelvic exam 09-18-2021 and Pelvic US on 10-2019 however I am unable to locate the results of the [place]. She has a history of PCOS and ovarian cysts 09/18/2021 text/html HPI Notes: 41yo, P3013, here for Medicare breast and pelvic exam. Is having slightly irregular menses every 1-2 months, not heavy, not really a problem. Not recently sexually active. Normal pap with neg HPV 3 years ago. Still with some left pelvic pain, mild, stable, tolerable. She feels like left breast gets full sometimes and she can express a solid white nodule out of the same duct of left nipple, no mass, no fever     Stopped smoking and now with sore throat/irritated.  Exam of throat was normal today.   Review of Systems  Constitutional:  Negative for fever, malaise/fatigue and weight loss.  HENT: Negative.  Negative for nosebleeds.   Eyes: Negative.  Negative for blurred vision, double vision and photophobia.  Respiratory: Negative.   Negative for cough and shortness of breath.   Cardiovascular: Negative.  Negative for chest pain, palpitations and leg swelling.  Gastrointestinal: Negative.  Negative for heartburn, nausea and vomiting.  Genitourinary:        SEE HPI  Musculoskeletal: Negative.  Negative for myalgias.  Neurological: Negative.  Negative for dizziness, focal weakness, seizures and headaches.  Psychiatric/Behavioral: Negative.  Negative for suicidal ideas.     Past Medical History:  Diagnosis Date   Anemia    Anxiety    Depression    Headache(784.0)    Kidney stone    Polycystic ovaries 05/15/2016   Thyroid condition     Past Surgical History:  Procedure Laterality Date   CESAREAN SECTION  2013    Family History  Problem Relation Age of Onset   Asthma Mother    Rashes / Skin problems Mother    Hypertension Mother    Colon polyps Mother    Hypertension Father    Heart disease Father    Breast cancer Maternal Aunt    Breast cancer Maternal Aunt    Breast cancer Maternal Aunt    Breast cancer Paternal Aunt    Breast cancer Paternal Aunt    Breast cancer Other    Colon cancer Neg Hx    Esophageal cancer Neg Hx    Stomach cancer Neg Hx    Rectal cancer Neg Hx     Social History Reviewed with no changes to be made today.  Outpatient Medications Prior to Visit  Medication Sig Dispense Refill   albuterol (VENTOLIN HFA) 108 (90 Base) MCG/ACT inhaler as needed.     ferrous sulfate 325 (65 FE) MG tablet Take 325 mg by mouth as needed (Only when her cycle will take only one tablet).   0   polyethylene glycol powder (GLYCOLAX/MIRALAX) 17 GM/SCOOP powder Take 17 g by mouth 2 (two) times daily as needed. 3350 g 1   VITAMIN D, ERGOCALCIFEROL, PO Take 1 tablet by mouth daily.      hydrocortisone (ANUSOL-HC) 25 MG suppository Place 1 suppository (25 mg total) rectally 2 (two) times daily. (Patient not taking: Reported on 02/10/2022) 12 suppository 0   NON FORMULARY Preparation H (Patient not  taking: Reported on 02/10/2022)     metFORMIN (GLUCOPHAGE-XR) 500 MG 24 hr tablet Take 500 mg by mouth 3 (three) times daily. (Patient not taking: Reported on 12/31/2021)     nicotine (NICODERM CQ - DOSED IN MG/24 HOURS) 21 mg/24hr patch Place 1 patch (21 mg total) onto the skin daily. (Patient not taking: Reported on 02/09/2022) 28 patch 1   No facility-administered medications prior to visit.    No Known Allergies     Objective:    BP 114/68 (BP Location: Left Arm, Patient Position: Sitting, Cuff Size: Normal)   Pulse 83   Temp 98.4 F (36.9 C) (Oral)   Ht '5\' 4"'$  (1.626 m)   Wt 161 lb (73 kg)   SpO2 100%   BMI 27.64 kg/m  Wt Readings from Last 3 Encounters:  02/10/22 161 lb (73 kg)  02/09/22 162 lb (73.5 kg)  12/31/21 159 lb (72.1 kg)    Physical Exam Vitals and nursing note reviewed.  Constitutional:      Appearance: She is well-developed.  HENT:     Head: Normocephalic and atraumatic.  Cardiovascular:     Rate and Rhythm: Normal rate and regular rhythm.     Heart sounds: Normal heart sounds. No murmur heard.    No friction rub. No gallop.  Pulmonary:     Effort: Pulmonary effort is normal. No tachypnea or respiratory distress.     Breath sounds: Normal breath sounds. No decreased breath sounds, wheezing, rhonchi or rales.  Chest:     Chest wall: No tenderness.  Abdominal:     General: Bowel sounds are normal.     Palpations: Abdomen is soft.  Musculoskeletal:        General: Normal range of motion.     Cervical back: Normal range of motion.  Skin:    General: Skin is warm and dry.  Neurological:     Mental Status: She is alert and oriented to person, place, and time.     Coordination: Coordination normal.  Psychiatric:        Behavior: Behavior normal. Behavior is cooperative.        Thought Content: Thought content normal.        Judgment: Judgment normal.          Patient has been counseled extensively about nutrition and exercise as well as the  importance of adherence with medications and regular follow-up. The patient was given clear instructions to go to ER or return to medical center if symptoms don't improve, worsen or new problems develop. The patient verbalized understanding.   Follow-up: Return if symptoms worsen or fail to improve.   Gildardo Pounds, FNP-BC [facility] and [facility], [place]   02/10/2022, 11:57 AM

## 2022-02-11 ENCOUNTER — Ambulatory Visit: Payer: Self-pay | Admitting: *Deleted

## 2022-02-11 NOTE — Telephone Encounter (Signed)
  Chief Complaint: requesting medical record information corrected from [place] 02/10/22. C/o bleeding from rectum on last day of menstrual cycle and left breast tenderness Symptoms: left breast tenderness, "feels like a vein swollen and sensitive to touch "ropelike". Feels like not addressed at [place]. Feels dismissed.  Frequency: yesterday  Pertinent Negatives: Patient denies denies dizziness , no fever.  Disposition: '[x]'$ ED /'[]'$ Urgent Care (no appt availability in office) / '[]'$ Appointment(In office/virtual)/ '[]'$  [facility]/ '[]'$ Home Care/ '[]'$ Refused Recommended Disposition /'[]'$ [place] [facility]/ '[]'$  Follow-up with PCP Additional Notes: please contact patient to address information in chart . Recommended to go to ED if rectal bleeding persists and/or call GI to notify of sx. Patient last seen by GI 02/09/22. Please advise regarding left breast issues. Patient feels dismissed regarding sx.    Reason for Disposition  Breast lump    Breast tenderness and feels vein that is sensitive to touch  [1] Caller requesting NON-URGENT health information AND [2] PCP's office is the best resource  Answer Assessment - Initial Assessment Questions 1. REASON FOR CALL or QUESTION: "What is your reason for calling today?" or "How can I best help you?" or "What question do you have that I can help answer?"     Requesting chart / documentation corrected from [place] 02/10/22.  Answer Assessment - Initial Assessment Questions 1. SYMPTOM: "What's the main symptom you're concerned about?"  (e.g., lump, pain, rash, nipple discharge)     Left breast nipple discharge 2. LOCATION: "Where is the tenderness located?"     Left breast  3. ONSET: "When did na  start?"     na 4. PRIOR HISTORY: "Do you have any history of prior problems with your breasts?" (e.g., lumps, cancer, fibrocystic breast disease)     na 5. CAUSE: "What do you think is causing this symptom?"     Not sure provider reported could be from just finishing  menstrual cycle 6. OTHER SYMPTOMS: "Do you have any other symptoms?" (e.g., fever, breast pain, redness or rash, nipple discharge)     Breast swollen, tender to touch, soreness feels like "ropelike" vein 7. PREGNANCY-BREASTFEEDING: "Is there any chance you are pregnant?" "When was your last menstrual period?" "Are you breastfeeding?"     na  Protocols used: Information Only Call - No Triage-A-AH, Breast Symptoms-A-AH

## 2022-02-12 ENCOUNTER — Encounter: Payer: Self-pay | Admitting: Nurse Practitioner

## 2022-02-13 NOTE — Telephone Encounter (Signed)
Attempted to contact patient VM was left informing patient to return call to schedule an appointment.  Patient also sent a mychart message.

## 2022-02-16 NOTE — Progress Notes (Deleted)
42 y.o. [id number] Single African American female here for NEW GYN/pelvic pain.    PCP:     No LMP recorded. (Menstrual status: Irregular Periods).           Sexually active: {yes no:314532}  The current method of family planning is {contraception:315051}.    Exercising: {yes no:314532}  {types:19826} Smoker:  {YES NO:22349}  Health Maintenance: Pap:  *** History of abnormal Pap:  {YES NO:22349} MMG:  05/08/21 Breast Density Category B, BI-RADS CATEGORY 1 Neg Colonoscopy:  04/18/19 BMD:   n/a  Result  n/a TDaP:  *** Gardasil:   {YES NO:22349} HIV: Hep C: Screening Labs:  Hb today: ***, Urine today: ***   reports that she has been smoking cigarettes. She has been smoking an average of .5 packs per day. She has never used smokeless tobacco. She reports current alcohol use. She reports that she does not use drugs.  Past Medical History:  Diagnosis Date   Anemia    Anxiety    Depression    Headache(784.0)    Kidney stone    Polycystic ovaries 05/15/2016   Thyroid condition     Past Surgical History:  Procedure Laterality Date   CESAREAN SECTION  2013    Current Outpatient Medications  Medication Sig Dispense Refill   albuterol (VENTOLIN HFA) 108 (90 Base) MCG/ACT inhaler as needed.     ferrous sulfate 325 (65 FE) MG tablet Take 325 mg by mouth as needed (Only when her cycle will take only one tablet).   0   polyethylene glycol powder (GLYCOLAX/MIRALAX) 17 GM/SCOOP powder Take 17 g by mouth 2 (two) times daily as needed. 3350 g 1   VITAMIN D, ERGOCALCIFEROL, PO Take 1 tablet by mouth daily.      No current facility-administered medications for this visit.    Family History  Problem Relation Age of Onset   Asthma Mother    Rashes / Skin problems Mother    Hypertension Mother    Colon polyps Mother    Hypertension Father    Heart disease Father    Breast cancer Maternal Aunt    Breast cancer Maternal Aunt    Breast cancer Maternal Aunt    Breast cancer Paternal Aunt     Breast cancer Paternal Aunt    Breast cancer Other    Colon cancer Neg Hx    Esophageal cancer Neg Hx    Stomach cancer Neg Hx    Rectal cancer Neg Hx     Review of Systems  Exam:   There were no vitals taken for this visit.    General appearance: alert, cooperative and appears stated age Head: normocephalic, without obvious abnormality, atraumatic Neck: no adenopathy, supple, symmetrical, trachea midline and thyroid normal to inspection and palpation Lungs: clear to auscultation bilaterally Breasts: normal appearance, no masses or tenderness, No nipple retraction or dimpling, No nipple discharge or bleeding, No axillary adenopathy Heart: regular rate and rhythm Abdomen: soft, non-tender; no masses, no organomegaly Extremities: extremities normal, atraumatic, no cyanosis or edema Skin: skin color, texture, turgor normal. No rashes or lesions Lymph nodes: cervical, supraclavicular, and axillary nodes normal. Neurologic: grossly normal  Pelvic: External genitalia:  no lesions              No abnormal inguinal nodes palpated.              Urethra:  normal appearing urethra with no masses, tenderness or lesions  Bartholins and Skenes: normal                 Vagina: normal appearing vagina with normal color and discharge, no lesions              Cervix: no lesions              Pap taken: {yes no:314532} Bimanual Exam:  Uterus:  normal size, contour, position, consistency, mobility, non-tender              Adnexa: no mass, fullness, tenderness              Rectal exam: {yes no:314532}.  Confirms.              Anus:  normal sphincter tone, no lesions  Chaperone was present for exam:  ***  Assessment:   Well woman visit with gynecologic exam.   Plan: Mammogram screening discussed. Self breast awareness reviewed. Pap and HR HPV as above. Guidelines for Calcium, Vitamin D, regular exercise program including cardiovascular and weight bearing exercise.   Follow up  annually and prn.   Additional counseling given.  {yes [id number]. _______ minutes face to face time of which over 50% was spent in counseling.    After visit summary provided.

## 2022-02-24 ENCOUNTER — Ambulatory Visit: Payer: 59 | Admitting: Gastroenterology

## 2022-02-25 ENCOUNTER — Ambulatory Visit: Payer: Medicare HMO | Admitting: Obstetrics and Gynecology

## 2022-03-03 ENCOUNTER — Telehealth: Payer: Self-pay | Admitting: Internal Medicine

## 2022-03-03 NOTE — Telephone Encounter (Signed)
Left message for patient to call back and schedule Medicare Annual Wellness Visit (AWV) either virtually or phone   Left  my jabber number [phone]   awvi 02/03/19 per [place]

## 2022-03-04 ENCOUNTER — Encounter: Payer: Self-pay | Admitting: Gastroenterology

## 2022-03-06 ENCOUNTER — Telehealth: Payer: Self-pay | Admitting: Gastroenterology

## 2022-03-06 NOTE — Telephone Encounter (Signed)
Good afternoon Dr. Fuller Plan,   Patient called stating that she needed to reschedule her colonoscopy on 2/7 at 9:30 due to not feeling well.   Patient was rescheduled for 3/13 at 10:30

## 2022-03-11 ENCOUNTER — Encounter: Payer: 59 | Admitting: Gastroenterology

## 2022-03-16 ENCOUNTER — Ambulatory Visit: Payer: Medicaid Other | Admitting: Obstetrics and Gynecology

## 2022-03-19 ENCOUNTER — Telehealth: Payer: Self-pay | Admitting: *Deleted

## 2022-03-19 ENCOUNTER — Ambulatory Visit: Payer: 59 | Attending: Internal Medicine

## 2022-03-19 VITALS — Ht 64.0 in | Wt 163.0 lb

## 2022-03-19 DIAGNOSIS — Z0000 Encounter for general adult medical examination without abnormal findings: Secondary | ICD-10-CM | POA: Diagnosis not present

## 2022-03-19 NOTE — Progress Notes (Signed)
Subjective:   Jaclyn Macdonald is a 42 y.o. female who presents for an Initial Medicare Annual Wellness Visit.  Review of Systems     Cardiac Risk Factors include: none     Objective:    Today's Vitals   03/19/22 1236  Weight: 163 lb (73.9 kg)  Height: 5' 4"$  (1.626 m)   Body mass index is 27.98 kg/m.     03/19/2022    1:05 PM  Advanced Directives  Does Patient Have a Medical Advance Directive? No    Current Medications (verified) Outpatient Encounter Medications as of 03/19/2022  Medication Sig   albuterol (VENTOLIN HFA) 108 (90 Base) MCG/ACT inhaler as needed.   ferrous sulfate 325 (65 FE) MG tablet Take 325 mg by mouth daily.   polyethylene glycol powder (GLYCOLAX/MIRALAX) 17 GM/SCOOP powder Take 17 g by mouth 2 (two) times daily as needed.   VITAMIN D, ERGOCALCIFEROL, PO Take 1 tablet by mouth daily.    No facility-administered encounter medications on file as of 03/19/2022.    Allergies (verified) Patient has no known allergies.   History: Past Medical History:  Diagnosis Date   Anemia    Anxiety    Depression    Headache(784.0)    Kidney stone    Polycystic ovaries 05/15/2016   Thyroid condition    Past Surgical History:  Procedure Laterality Date   CESAREAN SECTION  2013   Family History  Problem Relation Age of Onset   Asthma Mother    Rashes / Skin problems Mother    Hypertension Mother    Colon polyps Mother    Hypertension Father    Heart disease Father    Breast cancer Maternal Aunt    Breast cancer Maternal Aunt    Breast cancer Maternal Aunt    Breast cancer Paternal Aunt    Breast cancer Paternal Aunt    Breast cancer Other    Colon cancer Neg Hx    Esophageal cancer Neg Hx    Stomach cancer Neg Hx    Rectal cancer Neg Hx    Social History   Socioeconomic History   Marital status: Single    Spouse name: Not on file   Number of children: 3   Years of education: 14   Highest education level: Not on file  Occupational  History   Occupation: Unemployed  Tobacco Use   Smoking status: Former    Packs/day: 0.50    Types: Cigarettes    Start date: 12/03/2021   Smokeless tobacco: Never  Vaping Use   Vaping Use: Never used  Substance and Sexual Activity   Alcohol use: Yes    Alcohol/week: 0.0 standard drinks of alcohol    Comment: occasional   Drug use: No   Sexual activity: Not Currently  Other Topics Concern   Not on file  Social History Narrative   Lives at home with three children.   Right-handed.   1 cup coffee per day.   Social Determinants of Health   Financial Resource Strain: Low Risk  (03/19/2022)   Overall Financial Resource Strain (CARDIA)    Difficulty of Paying Living Expenses: Not hard at all  Food Insecurity: No Food Insecurity (03/19/2022)   Hunger Vital Sign    Worried About Running Out of Food in the Last Year: Never true    Ran Out of Food in the Last Year: Never true  Transportation Needs: No Transportation Needs (03/19/2022)   PRAPARE - Hydrologist (Medical):  No    Lack of Transportation (Non-Medical): No  Physical Activity: Inactive (03/19/2022)   Exercise Vital Sign    Days of Exercise per Week: 0 days    Minutes of Exercise per Session: 0 min  Stress: Stress Concern Present (03/19/2022)   [place]    Feeling of Stress : Very much  Social Connections: Not on file    Tobacco Counseling Counseling given: Not Answered   Clinical Intake:  Pre-visit preparation completed: Yes  Pain : No/denies pain     Nutritional Status: BMI 25 -29 Overweight Nutritional Risks: None Diabetes: No  How often do you need to have someone help you when you read instructions, pamphlets, or other written materials from your doctor or pharmacy?: 1 - Never  Diabetic? no  Interpreter Needed?: No  Information entered by :: NAllen LPN   Activities of Daily Living    03/19/2022    1:12  PM  In your present state of health, do you have any difficulty performing the following activities:  Hearing? 0  Vision? 0  Difficulty concentrating or making decisions? 1  Walking or climbing stairs? 0  Dressing or bathing? 0  Doing errands, shopping? 0  Preparing Food and eating ? N  Using the Toilet? N  In the past six months, have you accidently leaked urine? N  Do you have problems with loss of bowel control? N  Managing your Medications? N  Managing your Finances? N  Housekeeping or managing your Housekeeping? N    Patient Care Team: Ladell Pier, MD as PCP - General (Internal Medicine)  Indicate any recent Medical Services you may have received from other than [facility] providers in the past year (date may be approximate).     Assessment:   This is a routine wellness examination for Jaclyn Macdonald.  Hearing/Vision screen Vision Screening - Comments:: Regular eye exams,   Dietary issues and exercise activities discussed: Current Exercise Habits: The patient does not participate in regular exercise at present   Goals Addressed             This Visit's Progress    Patient Stated       03/19/2022, wants to start a workout regimen       Depression Screen    03/19/2022    1:05 PM 02/10/2022   11:51 AM 12/31/2021    3:36 PM 08/19/2021    8:57 AM  PHQ 2/9 Scores  PHQ - 2 Score 6 4 3 5  $ PHQ- 9 Score 15 17 15 19    $ Fall Risk    03/19/2022    1:05 PM 02/10/2022   10:59 AM 12/31/2021    3:34 PM 09/29/2021    3:26 PM  [place] in the past year? 0 0 0 0  Number falls in past yr: 0 0 0 0  Injury with Fall? 0 0 0 0  Risk for fall due to : No Fall Risks No Fall Risks No Fall Risks No Fall Risks  Follow up Falls prevention discussed;Education provided;Falls evaluation completed   Falls evaluation completed    FALL RISK PREVENTION PERTAINING TO THE HOME:  Any stairs in or around the home? Yes  If so, are there any without handrails? No  Home free of loose  throw rugs in walkways, pet beds, electrical cords, etc? Yes  Adequate lighting in your home to reduce risk of falls? Yes   ASSISTIVE DEVICES UTILIZED TO PREVENT  FALLS:  Life alert? No  Use of a cane, walker or w/c? No  Grab bars in the bathroom? No  Shower chair or bench in shower? No  Elevated toilet seat or a handicapped toilet? No   TIMED UP AND GO:  Was the test performed? No .      Cognitive Function:        03/19/2022    1:16 PM  6CIT Screen  What Year? 0 points  What month? 0 points  What time? 0 points  Count back from 20 0 points  Months in reverse 0 points  Repeat phrase 6 points  Total Score 6 points    Immunizations  There is no immunization history on file for this patient.  TDAP status: Due, Education has been provided regarding the importance of this vaccine. Advised may receive this vaccine at local pharmacy or Health Dept. Aware to provide a copy of the vaccination record if obtained from local pharmacy or Health Dept. Verbalized acceptance and understanding.  Flu Vaccine status: Declined, Education has been provided regarding the importance of this vaccine but patient still declined. Advised may receive this vaccine at local pharmacy or Health Dept. Aware to provide a copy of the vaccination record if obtained from local pharmacy or Health Dept. Verbalized acceptance and understanding.  Pneumococcal vaccine status: Declined,  Education has been provided regarding the importance of this vaccine but patient still declined. Advised may receive this vaccine at local pharmacy or Health Dept. Aware to provide a copy of the vaccination record if obtained from local pharmacy or Health Dept. Verbalized acceptance and understanding.   Covid-19 vaccine status: Declined, Education has been provided regarding the importance of this vaccine but patient still declined. Advised may receive this vaccine at local pharmacy or Health Dept.or vaccine clinic. Aware to provide  a copy of the vaccination record if obtained from local pharmacy or Health Dept. Verbalized acceptance and understanding.  Qualifies for Shingles Vaccine? No   Zostavax completed  n/a   Shingrix Completed?: n/a  Screening Tests Health Maintenance  Topic Date Due   Medicare Annual Wellness (AWV)  Never done   COVID-19 Vaccine (1) Never done   HIV Screening  Never done   Hepatitis C Screening  Never done   DTaP/Tdap/Td (1 - Tdap) Never done   PAP SMEAR-Modifier  02/14/2021   INFLUENZA VACCINE  Never done   COLONOSCOPY (Pts 45-78yr Insurance coverage will need to be confirmed)  04/18/2022   HPV VACCINES  Aged Out    Health Maintenance  Health Maintenance Due  Topic Date Due   Medicare Annual Wellness (AWV)  Never done   COVID-19 Vaccine (1) Never done   HIV Screening  Never done   Hepatitis C Screening  Never done   DTaP/Tdap/Td (1 - Tdap) Never done   PAP SMEAR-Modifier  02/14/2021   INFLUENZA VACCINE  Never done    Colorectal cancer screening: has one coming up  Mammogram status: due  Bone Density status: n/a  Lung Cancer Screening: (Low Dose CT Chest recommended if Age 41-80years, 30 pack-year currently smoking OR have quit w/in 15years.) does not qualify.   Lung Cancer Screening Referral: no  Additional Screening:  Hepatitis C Screening: does qualify;   Vision Screening: Recommended annual ophthalmology exams for early detection of glaucoma and other disorders of the eye. Is the patient up to date with their annual eye exam?  Yes  Who is the provider or what is the name of the office  in which the patient attends annual eye exams? Can't remember name If pt is not established with a provider, would they like to be referred to a provider to establish care? No .   Dental Screening: Recommended annual dental exams for proper oral hygiene  Community Resource Referral / Chronic Care Management: CRR required this visit?  Yes   CCM required this visit?  No       Plan:     I have personally reviewed and noted the following in the patient's chart:   Medical and social history Use of alcohol, tobacco or illicit drugs  Current medications and supplements including opioid prescriptions. Patient is not currently taking opioid prescriptions. Functional ability and status Nutritional status Physical activity Advanced directives List of other physicians Hospitalizations, surgeries, and ER visits in previous 12 months Vitals Screenings to include cognitive, depression, and falls Referrals and appointments  In addition, I have reviewed and discussed with patient certain preventive protocols, quality metrics, and best practice recommendations. A written personalized care plan for preventive services as well as general preventive health recommendations were provided to patient.     Kellie Simmering, LPN   [id number]   Nurse Notes: wants record from 02/10/2022 to be updated with accurate information. States that she did not say some things noted in the record.   Due to this being a virtual visit, the after visit summary with patients personalized plan was offered to patient via mail or my-chart.  Patient would like to access on my-chart

## 2022-03-19 NOTE — Patient Instructions (Signed)
Jaclyn Macdonald , Thank you for taking time to come for your Medicare Wellness Visit. I appreciate your ongoing commitment to your health goals. Please review the following plan we discussed and let me know if I can assist you in the future.   These are the goals we discussed:  Goals      Patient Stated     03/19/2022, wants to start a workout regimen        This is a list of the screening recommended for you and due dates:  Health Maintenance  Topic Date Due   COVID-19 Vaccine (1) Never done   HIV Screening  Never done   Hepatitis C Screening: USPSTF Recommendation to screen - Ages 39-79 yo.  Never done   DTaP/Tdap/Td vaccine (1 - Tdap) Never done   Pap Smear  02/14/2021   Flu Shot  Never done   Colon Cancer Screening  04/18/2022   Medicare Annual Wellness Visit  03/20/2023   HPV Vaccine  Aged Out    Advanced directives: Advance directive discussed with you today.   Conditions/risks identified: none  Next appointment: Follow up in one year for your annual wellness visit.   Preventive Care 40-64 Years, Female Preventive care refers to lifestyle choices and visits with your health care provider that can promote health and wellness. What does preventive care include? A yearly physical exam. This is also called an annual well check. Dental exams once or twice a year. Routine eye exams. Ask your health care provider how often you should have your eyes checked. Personal lifestyle choices, including: Daily care of your teeth and gums. Regular physical activity. Eating a healthy diet. Avoiding tobacco and drug use. Limiting alcohol use. Practicing safe sex. Taking low-dose aspirin daily starting at age 64. Taking vitamin and mineral supplements as recommended by your health care provider. What happens during an annual well check? The services and screenings done by your health care provider during your annual well check will depend on your age, overall health, lifestyle risk  factors, and family history of disease. Counseling  Your health care provider may ask you questions about your: Alcohol use. Tobacco use. Drug use. Emotional well-being. Home and relationship well-being. Sexual activity. Eating habits. Work and work Statistician. Method of birth control. Menstrual cycle. Pregnancy history. Screening  You may have the following tests or measurements: Height, weight, and BMI. Blood pressure. Lipid and cholesterol levels. These may be checked every 5 years, or more frequently if you are over 39 years old. Skin check. Lung cancer screening. You may have this screening every year starting at age 88 if you have a 30-pack-year history of smoking and currently smoke or have quit within the past 15 years. Fecal occult blood test (FOBT) of the stool. You may have this test every year starting at age 55. Flexible sigmoidoscopy or colonoscopy. You may have a sigmoidoscopy every 5 years or a colonoscopy every 10 years starting at age 69. Hepatitis C blood test. Hepatitis B blood test. Sexually transmitted disease (STD) testing. Diabetes screening. This is done by checking your blood sugar (glucose) after you have not eaten for a while (fasting). You may have this done every 1-3 years. Mammogram. This may be done every 1-2 years. Talk to your health care provider about when you should start having regular mammograms. This may depend on whether you have a family history of breast cancer. BRCA-related cancer screening. This may be done if you have a family history of breast, ovarian, tubal,  or peritoneal cancers. Pelvic exam and Pap test. This may be done every 3 years starting at age 8. Starting at age 23, this may be done every 5 years if you have a Pap test in combination with an HPV test. Bone density scan. This is done to screen for osteoporosis. You may have this scan if you are at high risk for osteoporosis. Discuss your test results, treatment options, and if  necessary, the need for more tests with your health care provider. Vaccines  Your health care provider may recommend certain vaccines, such as: Influenza vaccine. This is recommended every year. Tetanus, diphtheria, and acellular pertussis (Tdap, Td) vaccine. You may need a Td booster every 10 years. Zoster vaccine. You may need this after age 22. Pneumococcal 13-valent conjugate (PCV13) vaccine. You may need this if you have certain conditions and were not previously vaccinated. Pneumococcal polysaccharide (PPSV23) vaccine. You may need one or two doses if you smoke cigarettes or if you have certain conditions. Talk to your health care provider about which screenings and vaccines you need and how often you need them. This information is not intended to replace advice given to you by your health care provider. Make sure you discuss any questions you have with your health care provider. Document Released: 02/15/2015 Document Revised: 10/09/2015 Document Reviewed: 11/20/2014 Elsevier Interactive Patient Education  2017 [place] Prevention in the Home Falls can cause injuries. They can happen to people of all ages. There are many things you can do to make your home safe and to help prevent falls. What can I do on the outside of my home? Regularly fix the edges of walkways and driveways and fix any cracks. Remove anything that might make you trip as you walk through a door, such as a raised step or threshold. Trim any bushes or trees on the path to your home. Use bright outdoor lighting. Clear any walking paths of anything that might make someone trip, such as rocks or tools. Regularly check to see if handrails are loose or broken. Make sure that both sides of any steps have handrails. Any raised decks and porches should have guardrails on the edges. Have any leaves, snow, or ice cleared regularly. Use sand or salt on walking paths during winter. Clean up any spills in your  garage right away. This includes oil or grease spills. What can I do in the bathroom? Use night lights. Install grab bars by the toilet and in the tub and shower. Do not use towel bars as grab bars. Use non-skid mats or decals in the tub or shower. If you need to sit down in the shower, use a plastic, non-slip stool. Keep the floor dry. Clean up any water that spills on the floor as soon as it happens. Remove soap buildup in the tub or shower regularly. Attach bath mats securely with double-sided non-slip rug tape. Do not have throw rugs and other things on the floor that can make you trip. What can I do in the bedroom? Use night lights. Make sure that you have a light by your bed that is easy to reach. Do not use any sheets or blankets that are too big for your bed. They should not hang down onto the floor. Have a firm chair that has side arms. You can use this for support while you get dressed. Do not have throw rugs and other things on the floor that can make you trip. What can I do  in the kitchen? Clean up any spills right away. Avoid walking on wet floors. Keep items that you use a lot in easy-to-reach places. If you need to reach something above you, use a strong step stool that has a grab bar. Keep electrical cords out of the way. Do not use floor polish or wax that makes floors slippery. If you must use wax, use non-skid floor wax. Do not have throw rugs and other things on the floor that can make you trip. What can I do with my stairs? Do not leave any items on the stairs. Make sure that there are handrails on both sides of the stairs and use them. Fix handrails that are broken or loose. Make sure that handrails are as long as the stairways. Check any carpeting to make sure that it is firmly attached to the stairs. Fix any carpet that is loose or worn. Avoid having throw rugs at the top or bottom of the stairs. If you do have throw rugs, attach them to the floor with carpet  tape. Make sure that you have a light switch at the top of the stairs and the bottom of the stairs. If you do not have them, ask someone to add them for you. What else can I do to help prevent falls? Wear shoes that: Do not have high heels. Have rubber bottoms. Are comfortable and fit you well. Are closed at the toe. Do not wear sandals. If you use a stepladder: Make sure that it is fully opened. Do not climb a closed stepladder. Make sure that both sides of the stepladder are locked into place. Ask someone to hold it for you, if possible. Clearly mark and make sure that you can see: Any grab bars or handrails. First and last steps. Where the edge of each step is. Use tools that help you move around (mobility aids) if they are needed. These include: Canes. Walkers. Scooters. Crutches. Turn on the lights when you go into a dark area. Replace any light bulbs as soon as they burn out. Set up your furniture so you have a clear path. Avoid moving your furniture around. If any of your floors are uneven, fix them. If there are any pets around you, be aware of where they are. Review your medicines with your doctor. Some medicines can make you feel dizzy. This can increase your chance of falling. Ask your doctor what other things that you can do to help prevent falls. This information is not intended to replace advice given to you by your health care provider. Make sure you discuss any questions you have with your health care provider. Document Released: 11/15/2008 Document Revised: 06/27/2015 Document Reviewed: 02/23/2014 Elsevier Interactive Patient Education  2017 [employer].

## 2022-03-19 NOTE — Progress Notes (Signed)
  Care Coordination   Note   03/19/2022 Name: Jaclyn Macdonald MRN: [id number] DOB: 1980-05-15  Jaclyn Macdonald is a 42 y.o. year old female who sees Ladell Pier, MD for primary care. I reached out to [name] by phone today to in response to a referral    Follow up plan:  Telephone appointment with team member scheduled for:  03/26/2022  Encounter Outcome:  Pt. Scheduled  Julian Hy, Ama Direct Dial: [phone]

## 2022-03-23 NOTE — Progress Notes (Signed)
I connected with  Jaclyn Macdonald on 03/19/22 by a audio enabled telemedicine application and verified that I am speaking with the correct person using two identifiers.  Patient Location: Home  Provider Location: Office/Clinic  I discussed the limitations of evaluation and management by telemedicine. The patient expressed understanding and agreed to proceed.

## 2022-03-25 ENCOUNTER — Encounter: Payer: Self-pay | Admitting: Radiology

## 2022-03-25 ENCOUNTER — Ambulatory Visit (INDEPENDENT_AMBULATORY_CARE_PROVIDER_SITE_OTHER): Payer: 59 | Admitting: Radiology

## 2022-03-25 VITALS — BP 112/70 | Ht 64.5 in | Wt 167.0 lb

## 2022-03-25 DIAGNOSIS — B9689 Other specified bacterial agents as the cause of diseases classified elsewhere: Secondary | ICD-10-CM

## 2022-03-25 DIAGNOSIS — E282 Polycystic ovarian syndrome: Secondary | ICD-10-CM

## 2022-03-25 DIAGNOSIS — N760 Acute vaginitis: Secondary | ICD-10-CM

## 2022-03-25 DIAGNOSIS — R102 Pelvic and perineal pain: Secondary | ICD-10-CM

## 2022-03-25 MED ORDER — METRONIDAZOLE 0.75 % VA GEL: 1.0000 | Freq: Every day | VAGINAL | 0 refills | Status: DC

## 2022-03-25 NOTE — Progress Notes (Signed)
      Subjective: Jaclyn Macdonald is a 42 y.o. G65P3 female who complains of intermittent pelvic pain for the past 2-3 years. Causes her to double over, lasts less than 1 hour. Is not associated with her period. Denies any bowel or bladder changes. Does report some vaginal irritation. She has a history of PCOS and ovarian cysts in the past.    Review of Systems  All other systems reviewed and are negative.   Past Medical History:  Diagnosis Date   Anemia    Anxiety    Depression    Headache(784.0)    Kidney stone    Polycystic ovaries 05/15/2016   Thyroid condition       Objective:  Today's Vitals   03/25/22 0921  BP: 112/70  Weight: 167 lb (75.8 kg)  Height: 5' 4.5" (1.638 m)   Body mass index is 28.22 kg/m.   -General: no acute distress -Vulva: without lesions or discharge -Vagina: + discharge present -Cervix: no lesion or discharge, no CMT -Perineum: no lesions -Uterus: Mobile, non tender -Adnexa: no masses or tenderness   Urine dipstick shows negative for all components.  Micro exam: 0-5 WBC's per HPF, 6-10+ bacteria, and contaminated specimen with clue cells present.   Chaperone offered and declined.  Assessment:/Plan:   1. Pelvic pain  - Pregnancy, urine; negative - Urinalysis,Complete w/RFL Culture - US Transvaginal Non-OB; Future  2. PCOS (polycystic ovarian syndrome)  3. BV (bacterial vaginosis) - metroNIDAZOLE (METROGEL) 0.75 % vaginal gel; Place 1 Applicatorful vaginally at bedtime.  Dispense: 70 g; Refill: 0

## 2022-03-26 ENCOUNTER — Ambulatory Visit: Payer: Self-pay | Admitting: Licensed Clinical Social Worker

## 2022-03-27 ENCOUNTER — Telehealth: Payer: Self-pay

## 2022-03-27 LAB — URINALYSIS, COMPLETE W/RFL CULTURE
Bilirubin Urine: NEGATIVE
Casts: NONE SEEN /LPF
Crystals: NONE SEEN /HPF
Glucose, UA: NEGATIVE
Hgb urine dipstick: NEGATIVE
Hyaline Cast: NONE SEEN /LPF
Ketones, ur: NEGATIVE
Leukocyte Esterase: NEGATIVE
Nitrites, Initial: NEGATIVE
Protein, ur: NEGATIVE
RBC / HPF: NONE SEEN /HPF (ref 0–2)
Specific Gravity, Urine: 1.015 (ref 1.001–1.035)
Yeast: NONE SEEN /HPF
pH: 6 (ref 5.0–8.0)

## 2022-03-27 LAB — URINE CULTURE
MICRO NUMBER:: 14594787
Result:: NO GROWTH
SPECIMEN QUALITY:: ADEQUATE

## 2022-03-27 LAB — PREGNANCY, URINE: Preg Test, Ur: NEGATIVE

## 2022-03-27 LAB — CULTURE INDICATED

## 2022-03-27 NOTE — Patient Instructions (Signed)
Visit Information  Thank you for taking time to visit with me today. Please don't hesitate to contact me if I can be of assistance to you.   Following are the goals we discussed today:   Goals Addressed             This Visit's Progress    Obtain Supportive Resources-Counselor   On track    Care Coordination Interventions: Solution-Focused Strategies employed:  Active listening / Reflection utilized  Emotional Support Provided Provided psychoeducation for mental health needs  Participation in counseling encouraged  Verbalization of feelings encouraged  Patient understands correlation between physical and mental health conditions Endorses difficulty managing anxiety symptoms. States she has not been functioning well and no supportive Symptoms include social anxiety resulting in panic attacks, withdrawn behavior, intrusive thoughts Strengths advocates for self Identified triggers and plan to avoid them Healthy coping skills Hx of therapy prior to Covid Agreed to review website, Psychology Today, to search for counselor and their modalities          Our next appointment is by telephone on 04/06/22 at 9 AM  Please call the care guide team at [phone] if you need to cancel or reschedule your appointment.   If you are experiencing a Mental Health or [place] or need someone to talk to, please call the Suicide and Crisis Lifeline: 988 call 911   Patient verbalizes understanding of instructions and care plan provided today and agrees to view in [place]. Active MyChart status and patient understanding of how to access instructions and care plan via MyChart confirmed with patient.     Christa See, MSW, [email or web address] Phone [phone] 5:15 AM

## 2022-03-27 NOTE — Telephone Encounter (Signed)
Per JC: "Use metrogel after cycle."   LDVM on machine per DPR.   Will close encounter.

## 2022-03-27 NOTE — Patient Outreach (Signed)
  Care Coordination   Initial Visit Note   03/26/22 Name: Jaclyn Macdonald MRN: [id number] DOB: 1980-06-27  Jaclyn Macdonald is a 42 y.o. year old female who sees Jaclyn Pier, MD for primary care. I spoke with  Jaclyn Macdonald by phone today.  What matters to the patients health and wellness today?  Anxiety and Depression, Symptom Management    Goals Addressed             This Visit's Progress    Obtain Supportive Resources-Counselor   On track    Care Coordination Interventions: Solution-Focused Strategies employed:  Active listening / Reflection utilized  Emotional Support Provided Provided psychoeducation for mental health needs  Participation in counseling encouraged  Verbalization of feelings encouraged  Patient understands correlation between physical and mental health conditions Endorses difficulty managing anxiety symptoms. States she has not been functioning well and no supportive Symptoms include social anxiety resulting in panic attacks, withdrawn behavior, intrusive thoughts Strengths advocates for self Identified triggers and plan to avoid them Healthy coping skills Hx of therapy prior to Covid Agreed to review website, Psychology Today, to search for counselor and their modalities          SDOH assessments and interventions completed:  No     Care Coordination Interventions:  Yes, provided   Follow up plan: Follow up call scheduled for 2 weeks    Encounter Outcome:  Pt. Visit Completed   Jaclyn Macdonald, MSW, [email or web address] Phone [phone] 5:14 AM

## 2022-03-27 NOTE — Telephone Encounter (Signed)
Pt calling to inquire if she should use metrogel for recent dx of BV at appt on 03/25/22 since she is planning to start cycle very soon or if she should take oral option? Also states she could just wait until cycle is over to start gel rx? Please advise.

## 2022-04-06 ENCOUNTER — Ambulatory Visit: Payer: Self-pay | Admitting: Licensed Clinical Social Worker

## 2022-04-06 ENCOUNTER — Telehealth: Payer: Self-pay | Admitting: Licensed Clinical Social Worker

## 2022-04-06 ENCOUNTER — Telehealth: Payer: Self-pay | Admitting: Gastroenterology

## 2022-04-06 NOTE — Telephone Encounter (Signed)
New instructions have been mailed and sent to My Chart  Pt aware

## 2022-04-06 NOTE — Patient Outreach (Signed)
  Care Coordination   04/06/2022 Name: Jaclyn Macdonald MRN: [id number] DOB: 1980/02/22   Care Coordination Outreach Attempts:  An unsuccessful telephone outreach was attempted for a scheduled appointment today.  Follow Up Plan:  Additional outreach attempts will be made to offer the patient care coordination information and services.   Encounter Outcome:  No Answer   Care Coordination Interventions:  No, not indicated    Christa See, MSW, [email or web address] Phone [phone] 9:20 AM

## 2022-04-06 NOTE — Telephone Encounter (Signed)
Patient call requesting new instruction for her up coming procedure on 3/13.Please advise

## 2022-04-09 NOTE — Patient Outreach (Signed)
  Care Coordination   Follow Up Visit Note   04/09/2022 Name: Jaclyn Macdonald MRN: [id number] DOB: January 09, 1981  Jaclyn Macdonald is a 42 y.o. year old female who sees Jaclyn Pier, MD for primary care. I spoke with  Jaclyn Macdonald by phone today.  What matters to the patients health and wellness today?  [place] [facility]    Goals Addressed             This Visit's Progress    Obtain Supportive Resources-Counselor   On track    Activities and task to complete in order to accomplish goals.   Patient agreed to review website, Psychology Today, to search for counselor and their modalities          SDOH assessments and interventions completed:  No     Care Coordination Interventions:  Yes, provided  Interventions Today    Flowsheet Row Most Recent Value  General Interventions   General Interventions Discussed/Reviewed General Interventions Reviewed  [place]  [LCSW re-sent resource to email, per pt request. LCSW inquired about any additional resource needs, none identified. Will f/up with patient]       Follow up plan: Follow up call scheduled for 2 weeks    Encounter Outcome:  Pt. Visit Completed   Jaclyn Macdonald, MSW, [email or web address] Phone [phone] 1:18 PM

## 2022-04-09 NOTE — Patient Instructions (Signed)
Visit Information  Thank you for taking time to visit with me today. Please don't hesitate to contact me if I can be of assistance to you.   Following are the goals we discussed today:   Goals Addressed             This Visit's Progress    Obtain Supportive Resources-Counselor   On track    Activities and task to complete in order to accomplish goals.   Patient agreed to review website, Psychology Today, to search for counselor and their modalities          Our next appointment is by telephone on 3/21 at 1 PM  Please call the care guide team at [phone] if you need to cancel or reschedule your appointment.   If you are experiencing a Mental Health or [place] or need someone to talk to, please call the Suicide and Crisis Lifeline: 988 call 911   Patient verbalizes understanding of instructions and care plan provided today and agrees to view in [place]. Active MyChart status and patient understanding of how to access instructions and care plan via MyChart confirmed with patient.     Christa See, MSW, [email or web address] Phone [phone] 1:18 PM

## 2022-04-09 NOTE — Patient Outreach (Signed)
  Care Coordination   Follow Up Visit Note   04/09/2022 Name: Kiley Fowler MRN: [id number] DOB: Nov 27, 1980  Sumiye Waskey is a 42 y.o. year old female who sees Ladell Pier, MD for primary care. I spoke with  [name] by phone today.  What matters to the patients health and wellness today?  [place] Resources    Goals Addressed             This Visit's Progress    Obtain Supportive Resources-Counselor   On track    Activities and task to complete in order to accomplish goals.   Patient agreed to review website, Psychology Today, to search for counselor and their modalities          SDOH assessments and interventions completed:  No     Care Coordination Interventions:  Yes, provided   Follow up plan: Follow up call scheduled for 2 weeks    Encounter Outcome:  Pt. Visit Completed   Christa See, MSW, [email or web address] Phone [phone] 1:17 PM

## 2022-04-15 ENCOUNTER — Encounter: Payer: Self-pay | Admitting: Gastroenterology

## 2022-04-15 ENCOUNTER — Ambulatory Visit (AMBULATORY_SURGERY_CENTER): Payer: 59 | Admitting: Gastroenterology

## 2022-04-15 VITALS — BP 113/71 | HR 77 | Temp 98.6°F | Resp 16 | Ht 64.0 in | Wt 162.0 lb

## 2022-04-15 DIAGNOSIS — Z09 Encounter for follow-up examination after completed treatment for conditions other than malignant neoplasm: Secondary | ICD-10-CM

## 2022-04-15 DIAGNOSIS — Z86010 Personal history of colonic polyps: Secondary | ICD-10-CM

## 2022-04-15 DIAGNOSIS — K921 Melena: Secondary | ICD-10-CM

## 2022-04-15 MED ORDER — SODIUM CHLORIDE 0.9 % IV SOLN: 500.0000 mL | Freq: Once | INTRAVENOUS | Status: DC

## 2022-04-15 NOTE — Op Note (Signed)
[facility] Patient Name: Jaclyn Macdonald Procedure Date: 04/15/2022 10:04 AM MRN: [id number] Endoscopist: Ladene Artist , MD, [id number] Age: 42 Referring MD:  Date of Birth: 01/09/1981 Gender: Female Account #: [id number] Procedure:                Colonoscopy Indications:              Surveillance: Personal history of adenomatous                            polyps on last colonoscopy 3 years ago, Hematochezia Medicines:                Monitored Anesthesia Care Procedure:                Pre-Anesthesia Assessment:                           - Prior to the procedure, a History and Physical                            was performed, and patient medications and                            allergies were reviewed. The patient's tolerance of                            previous anesthesia was also reviewed. The risks                            and benefits of the procedure and the sedation                            options and risks were discussed with the patient.                            All questions were answered, and informed consent                            was obtained. Prior Anticoagulants: The patient has                            taken no anticoagulant or antiplatelet agents. ASA                            Grade Assessment: II - A patient with mild systemic                            disease. After reviewing the risks and benefits,                            the patient was deemed in satisfactory condition to                            undergo the procedure.  After obtaining informed consent, the colonoscope                            was passed under direct vision. Throughout the                            procedure, the patient's blood pressure, pulse, and                            oxygen saturations were monitored continuously. The                            Olympus scope [id number] was introduced through the                            anus  and advanced to the the cecum, identified by                            appendiceal orifice and ileocecal valve. The                            ileocecal valve, appendiceal orifice, and rectum                            were photographed. The quality of the bowel                            preparation was excellent. The colonoscopy was                            performed without difficulty. The patient tolerated                            the procedure well. Scope In: 10:20:53 AM Scope Out: 10:31:34 AM Scope Withdrawal Time: 0 hours 8 minutes 31 seconds  Total Procedure Duration: 0 hours 10 minutes 41 seconds  Findings:                 The perianal and digital rectal examinations were                            normal.                           Internal hemorrhoids were found during                            retroflexion. The hemorrhoids were moderate and                            Grade I (internal hemorrhoids that do not prolapse).                           The exam was otherwise without abnormality on  direct and retroflexion views. Complications:            No immediate complications. Estimated blood loss:                            None. Estimated Blood Loss:     Estimated blood loss: none. Impression:               - Internal hemorrhoids.                           - The examination was otherwise normal on direct                            and retroflexion views.                           - No specimens collected. Recommendation:           - Repeat colonoscopy in 5 years for surveillance.                           - Patient has a contact number available for                            emergencies. The signs and symptoms of potential                            delayed complications were discussed with the                            patient. Return to normal activities tomorrow.                            Written discharge instructions were provided to the                             patient.                           - Resume previous diet.                           - Continue present medications.                           - If hemorrhoid symptoms persist consider banding. Ladene Artist, MD 04/15/2022 10:35:41 AM This report has been signed electronically.

## 2022-04-15 NOTE — Progress Notes (Signed)
History & Physical  Primary Care Physician:  Ladell Pier, MD Primary Gastroenterologist: Lucio Edward, MD  Impression / Plan:  Hematochezia, personal history of adenomatous colon polyps for colonoscopy.  CHIEF COMPLAINT: Hematochezia, Personal history of colon polyps   HPI: Jaclyn Macdonald is a 42 y.o. female with hematochezia and a personal history of adenomatous colon polyps for colonoscopy.   Past Medical History:  Diagnosis Date   Anemia    Anxiety    Depression    GERD (gastroesophageal reflux disease)    Headache(784.0)    Hyperlipidemia    Kidney stone    Polycystic ovaries 05/15/2016   Thyroid condition     Past Surgical History:  Procedure Laterality Date   CESAREAN SECTION  2013    Prior to Admission medications   Medication Sig Start Date End Date Taking? Authorizing Provider  ferrous sulfate 325 (65 FE) MG tablet Take 325 mg by mouth daily. 09/05/14  Yes [provider]  VITAMIN D, ERGOCALCIFEROL, PO Take 1 tablet by mouth daily.    Yes [provider]  albuterol (VENTOLIN HFA) 108 (90 Base) MCG/ACT inhaler as needed.    [provider]  metroNIDAZOLE (METROGEL) 0.75 % vaginal gel Place 1 Applicatorful vaginally at bedtime. 03/25/22   Chrzanowski, Jami B, NP  polyethylene glycol powder (GLYCOLAX/MIRALAX) 17 GM/SCOOP powder Take 17 g by mouth 2 (two) times daily as needed. Patient not taking: Reported on 03/25/2022 12/31/21   Argentina Donovan, PA-C    Current Outpatient Medications  Medication Sig Dispense Refill   ferrous sulfate 325 (65 FE) MG tablet Take 325 mg by mouth daily.  0   VITAMIN D, ERGOCALCIFEROL, PO Take 1 tablet by mouth daily.      albuterol (VENTOLIN HFA) 108 (90 Base) MCG/ACT inhaler as needed.     metroNIDAZOLE (METROGEL) 0.75 % vaginal gel Place 1 Applicatorful vaginally at bedtime. 70 g 0   polyethylene glycol powder (GLYCOLAX/MIRALAX) 17 GM/SCOOP powder Take 17 g by mouth 2 (two) times daily as  needed. (Patient not taking: Reported on 03/25/2022) 3350 g 1   Current Facility-Administered Medications  Medication Dose Route Frequency Provider Last Rate Last Admin   0.9 %  sodium chloride infusion  500 mL Intravenous Once Ladene Artist, MD        Allergies as of 04/15/2022   (No Known Allergies)    Family History  Problem Relation Age of Onset   Asthma Mother    Rashes / Skin problems Mother    Hypertension Mother    Colon polyps Mother    Hypertension Father    Heart disease Father    Breast cancer Maternal Aunt    Breast cancer Maternal Aunt    Breast cancer Maternal Aunt    Breast cancer Paternal Aunt    Breast cancer Paternal Aunt    Breast cancer Other    Colon cancer Neg Hx    Esophageal cancer Neg Hx    Stomach cancer Neg Hx    Rectal cancer Neg Hx     Social History   Socioeconomic History   Marital status: Single    Spouse name: Not on file   Number of children: 3   Years of education: 14   Highest education level: Not on file  Occupational History   Occupation: Unemployed  Tobacco Use   Smoking status: Former    Packs/day: 0.50    Types: Cigarettes    Start date: 12/03/2021    Quit date: 12/24/2021  Years since quitting: 0.3   Smokeless tobacco: Never  Vaping Use   Vaping Use: Never used  Substance and Sexual Activity   Alcohol use: Yes    Alcohol/week: 0.0 standard drinks of alcohol    Comment: occasional   Drug use: No   Sexual activity: Not Currently    Partners: Male    Comment: menarche 81yo, sexual debut 42yo  Other Topics Concern   Not on file  Social History Narrative   Lives at home with three children.   Right-handed.   1 cup coffee per day.   Social Determinants of Health   Financial Resource Strain: Low Risk  (03/19/2022)   Overall Financial Resource Strain (CARDIA)    Difficulty of Paying Living Expenses: Not hard at all  Food Insecurity: No Food Insecurity (03/19/2022)   Hunger Vital Sign    Worried About Running  Out of Food in the Last Year: Never true    Ran Out of Food in the Last Year: Never true  Transportation Needs: No Transportation Needs (03/19/2022)   PRAPARE - Hydrologist (Medical): No    Lack of Transportation (Non-Medical): No  Physical Activity: Inactive (03/19/2022)   Exercise Vital Sign    Days of Exercise per Week: 0 days    Minutes of Exercise per Session: 0 min  Stress: Stress Concern Present (03/19/2022)   [place]    Feeling of Stress : Very much  Social Connections: Not on file  Intimate Partner Violence: Not on file    Review of Systems:  All systems reviewed were negative except where noted in HPI.   Physical Exam: General:  Alert, well-developed, in NAD Head:  Normocephalic and atraumatic. Eyes:  Sclera clear, no icterus.   Conjunctiva pink. Ears:  Normal auditory acuity. Mouth:  No deformity or lesions.  Neck:  Supple; no masses. Lungs:  Clear throughout to auscultation.   No wheezes, crackles, or rhonchi.  Heart:  Regular rate and rhythm; no murmurs. Abdomen:  Soft, nondistended, nontender. No masses, hepatomegaly. No palpable masses.  Normal bowel sounds.    Rectal:  Deferred   Msk:  Symmetrical without gross deformities. Extremities:  Without edema. Neurologic:  Alert and  oriented x 4; grossly normal neurologically. Skin:  Intact without significant lesions or rashes. Psych:  Alert and cooperative. Normal mood and affect.   [name]. [place] Plan  04/15/2022, 10:04 AM See Shea Evans, [facility], to contact our on call provider

## 2022-04-15 NOTE — Progress Notes (Signed)
Sedate, gd SR's, VSS, report to RN 

## 2022-04-15 NOTE — Patient Instructions (Signed)
-  Handout on hemorrhoids and hemorrhoid banding provided -repeat colonoscopy in 5 years for surveillance recommended.  -Continue present medications  -If hemorrhoid symptoms persist consider banding   YOU HAD AN ENDOSCOPIC PROCEDURE TODAY AT [facility]:   Refer to the procedure report that was given to you for any specific questions about what was found during the examination.  If the procedure report does not answer your questions, please call your gastroenterologist to clarify.  If you requested that your care partner not be given the details of your procedure findings, then the procedure report has been included in a sealed envelope for you to review at your convenience later.  YOU SHOULD EXPECT: Some feelings of bloating in the abdomen. Passage of more gas than usual.  Walking can help get rid of the air that was put into your GI tract during the procedure and reduce the bloating. If you had a lower endoscopy (such as a colonoscopy or flexible sigmoidoscopy) you may notice spotting of blood in your stool or on the toilet paper. If you underwent a bowel prep for your procedure, you may not have a normal bowel movement for a few days.  Please Note:  You might notice some irritation and congestion in your nose or some drainage.  This is from the oxygen used during your procedure.  There is no need for concern and it should clear up in a day or so.  SYMPTOMS TO REPORT IMMEDIATELY:  Following lower endoscopy (colonoscopy or flexible sigmoidoscopy):  Excessive amounts of blood in the stool  Significant tenderness or worsening of abdominal pains  Swelling of the abdomen that is new, acute  Fever of 100F or higher  For urgent or emergent issues, a gastroenterologist can be reached at any hour by calling [phone]. Do not use MyChart messaging for urgent concerns.    DIET:  We do recommend a small meal at first, but then you may proceed to your regular diet.  Drink plenty of  fluids but you should avoid alcoholic beverages for 24 hours.  ACTIVITY:  You should plan to take it easy for the rest of today and you should NOT DRIVE or use heavy machinery until tomorrow (because of the sedation medicines used during the test).    FOLLOW UP: Our staff will call the number listed on your records the next business day following your procedure.  We will call around 7:15- 8:00 am to check on you and address any questions or concerns that you may have regarding the information given to you following your procedure. If we do not reach you, we will leave a message.     If any biopsies were taken you will be contacted by phone or by letter within the next 1-3 weeks.  Please call us at [phone] if you have not heard about the biopsies in 3 weeks.    SIGNATURES/CONFIDENTIALITY: You and/or your care partner have signed paperwork which will be entered into your electronic medical record.  These signatures attest to the fact that that the information above on your After Visit Summary has been reviewed and is understood.  Full responsibility of the confidentiality of this discharge information lies with you and/or your care-partner.

## 2022-04-16 ENCOUNTER — Telehealth: Payer: Self-pay

## 2022-04-16 NOTE — Telephone Encounter (Signed)
  Follow up Call-     04/15/2022    9:54 AM  Call back number  Post procedure Call Back phone  # [phone]  Permission to leave phone message Yes     Patient questions:  Do you have a fever, pain , or abdominal swelling? No. Pain Score  0 *  Have you tolerated food without any problems? Yes.    Have you been able to return to your normal activities? Yes.    Do you have any questions about your discharge instructions: Diet   No. Medications  No. Follow up visit  No.  Do you have questions or concerns about your Care? No.  Actions: * If pain score is 4 or above: No action needed, pain <4.

## 2022-04-23 ENCOUNTER — Ambulatory Visit: Payer: Self-pay | Admitting: Licensed Clinical Social Worker

## 2022-04-23 NOTE — Patient Outreach (Signed)
  Care Coordination   Follow Up Visit Note   04/23/2022 Name: Kerriann Schnackenberg MRN: [id number] DOB: 11-22-1980  Severine Veth is a 42 y.o. year old female who sees Ladell Pier, MD for primary care. I spoke with  [name] by phone today.  What matters to the patients health and wellness today?  Insurance Verification    Goals Addressed             This Visit's Progress    Obtain Supportive Resources-Counselor   On track    Activities and task to complete in order to accomplish goals.   Patient agreed to review website, Psychology Today, to search for counselor and their modalities          SDOH assessments and interventions completed:  No     Care Coordination Interventions:  Yes, provided  Interventions Today    Flowsheet Row Most Recent Value  General Interventions   General Interventions Discussed/Reviewed General Interventions Reviewed  [LCSW verified pt's insurance (Medicaid)]  Mental Health Interventions   Mental Health Discussed/Reviewed Mental Health Reviewed, Coping Strategies  [LCSW inquired about triggers to stress. Patient is doing well and will follow up with supportive resources provided]       Follow up plan: Follow up call scheduled for 6-8 weeks    Encounter Outcome:  Pt. Visit Completed   Christa See, MSW, [email or web address] Phone [phone] 4:37 PM

## 2022-04-23 NOTE — Patient Instructions (Signed)
Visit Information  Thank you for taking time to visit with me today. Please don't hesitate to contact me if I can be of assistance to you.   Following are the goals we discussed today:   Goals Addressed             This Visit's Progress    Obtain Supportive Resources-Counselor   On track    Activities and task to complete in order to accomplish goals.   Patient agreed to review website, Psychology Today, to search for counselor and their modalities          Our next appointment is by telephone on 06/05 at 11 AM  Please call the care guide team at [phone] if you need to cancel or reschedule your appointment.   If you are experiencing a Mental Health or [place] or need someone to talk to, please call the Suicide and Crisis Lifeline: 988 call 911   Patient verbalizes understanding of instructions and care plan provided today and agrees to view in [place]. Active MyChart status and patient understanding of how to access instructions and care plan via MyChart confirmed with patient.     Christa See, MSW, [email or web address] Phone [phone] 4:39 PM

## 2022-04-30 ENCOUNTER — Ambulatory Visit (INDEPENDENT_AMBULATORY_CARE_PROVIDER_SITE_OTHER): Payer: 59

## 2022-04-30 ENCOUNTER — Ambulatory Visit (INDEPENDENT_AMBULATORY_CARE_PROVIDER_SITE_OTHER): Payer: 59 | Admitting: Radiology

## 2022-04-30 VITALS — BP 122/76

## 2022-04-30 DIAGNOSIS — R102 Pelvic and perineal pain: Secondary | ICD-10-CM

## 2022-04-30 DIAGNOSIS — L678 Other hair color and hair shaft abnormalities: Secondary | ICD-10-CM | POA: Diagnosis not present

## 2022-04-30 DIAGNOSIS — L700 Acne vulgaris: Secondary | ICD-10-CM | POA: Diagnosis not present

## 2022-04-30 NOTE — Progress Notes (Signed)
      Subjective: Jaclyn Macdonald is a 42 y.o. G80P3 female  here for pelvic u/s. She originally presented with complaints of intermittent pelvic pain for the past 2-3 years. Causes her to double over, lasts less than 1 hour. Is not associated with her period. Denies any bowel or bladder changes. She has a history of PCOS and ovarian cysts in the past. Concerned about acne and hair growth on her chin as well. Has tried Metformin in the past but reports it made her hair fall out.     Review of Systems  All other systems reviewed and are negative.   Past Medical History:  Diagnosis Date   Anemia    Anxiety    Depression    GERD (gastroesophageal reflux disease)    Headache(784.0)    Hyperlipidemia    Kidney stone    Polycystic ovaries 05/15/2016   Thyroid condition       Objective:  Today's Vitals   04/30/22 1148  BP: 122/76   There is no height or weight on file to calculate BMI.   Indication: pelvic pain   Vaginal u/s:   retroverted uterus normal size and shape Inhomogeneous myometrium with streaky shadowing suggestive of adenomyosis   Single small subserosal fibroid in the anterior lower uterine segment Small amount of fluid in cavity outlines symmetrical endometrial walls Endometrial thickness 4.55mm   Both ovaries mobile, small with normal perfusion Sparse tiny follicles bilaterally No adnexal masses or free fluid seen   Left adnexal area tender to palpation   Impression: Fibroid in uterus, adenomyosis  Assessment:/Plan:  1. Pelvic pain Reassured may be mittleschmerz or gas pains Will begin keeping a journal of when pain occurs Declines hormonal management at this time   2. Acne vulgaris 3. Abnormal facial hair Discussed side effects of spironolactone, will F/u with derm (established)  Schedule AEX

## 2022-05-08 ENCOUNTER — Other Ambulatory Visit: Payer: Self-pay | Admitting: Internal Medicine

## 2022-05-08 DIAGNOSIS — Z1231 Encounter for screening mammogram for malignant neoplasm of breast: Secondary | ICD-10-CM

## 2022-05-11 ENCOUNTER — Ambulatory Visit
Admission: RE | Admit: 2022-05-11 | Discharge: 2022-05-11 | Disposition: A | Discharge status: Home / Self Care | Payer: 59 | Source: Ambulatory Visit | Attending: Internal Medicine | Admitting: Internal Medicine

## 2022-05-11 ENCOUNTER — Ambulatory Visit: Payer: 59

## 2022-05-11 DIAGNOSIS — Z1231 Encounter for screening mammogram for malignant neoplasm of breast: Secondary | ICD-10-CM

## 2022-05-12 ENCOUNTER — Ambulatory Visit (INDEPENDENT_AMBULATORY_CARE_PROVIDER_SITE_OTHER): Payer: 59 | Admitting: Radiology

## 2022-05-12 VITALS — BP 122/82

## 2022-05-12 DIAGNOSIS — Z30011 Encounter for initial prescription of contraceptive pills: Secondary | ICD-10-CM

## 2022-05-12 DIAGNOSIS — N920 Excessive and frequent menstruation with regular cycle: Secondary | ICD-10-CM

## 2022-05-12 MED ORDER — SLYND 4 MG PO TABS
1.0000 | ORAL_TABLET | Freq: Every day | ORAL | 0 refills | Status: DC
Start: 2022-05-12 — End: 2023-08-09

## 2022-05-12 NOTE — Progress Notes (Signed)
Subjective: Jaclyn Macdonald is a 42 y.o. G54P3 female  here to further discuss her last  pelvic u/s. Anxious to discuss treatment for fibroids and pelvic pain. Sill with pelvic pain daily and pelvic floor tenderness x's 1 day. Patient researched on social media and is concerned she was misdiagnosed (social media person had cancer and not fibroids, they were misdiagnosed and had stage 3 cancer and died). Hx former cigarette smoker x 20 year. Very concerned regarding her smoking history and link to cancers. Keeps bringing that up when talking about other concerns and her reason for the visit.    She originally presented with complaints of intermittent pelvic pain for the past 2-3 years. Referred by PCP. Pain causes her to double over, lasts less than 1 hour. Is not associated with her period. Denies any bowel or bladder changes. She has a history of PCOS and ovarian cysts in the past. Concerned about acne and hair growth on her chin as well. Has tried Metformin in the past but reports it made her hair fall out.  At her last visit she was reassured pain unlikely coming from such a small fibroid may be mittleschmerz or gas pains. However she does not agree this is what is causing the pain and is set on the fact the fibroid is causing her pain based on what she read online when doing her own research. At her last visit she declined hormonal management for her pain and menorrhagia.    Review of Systems  All other systems reviewed and are negative.   Past Medical History:  Diagnosis Date   Anemia    Anxiety    Depression    GERD (gastroesophageal reflux disease)    Headache(784.0)    Hyperlipidemia    Kidney stone    Polycystic ovaries 05/15/2016   Thyroid condition       Objective:  Today's Vitals   05/12/22 0949  BP: 122/82   There is no height or weight on file to calculate BMI.   Indication: pelvic pain   Vaginal u/s:   retroverted uterus normal size and  shape Inhomogeneous myometrium with streaky shadowing suggestive of adenomyosis   Single small subserosal fibroid in the anterior lower uterine segment Small amount of fluid in cavity outlines symmetrical endometrial walls Endometrial thickness 4.11mm   Both ovaries mobile, small with normal perfusion Sparse tiny follicles bilaterally No adnexal masses or free fluid seen   Left adnexal area tender to palpation   Impression: Fibroid in uterus, adenomyosis  Assessment:/Plan:  1. Menorrhagia with regular cycle - Drospirenone (SLYND) 4 MG TABS; Take 1 tablet (4 mg total) by mouth daily.  Dispense: 84 tablet; Refill: 0  2. Encounter for initial prescription of contraceptive pills - Drospirenone (SLYND) 4 MG TABS; Take 1 tablet (4 mg total) by mouth daily.  Dispense: 84 tablet; Refill: 0    Total patient facing time . Patient was very anxious and difficult to redirect and keep on topic. Here to discuss fibroids, pelvic pain and bleeding. Was tearful on and off. Continued to bring up smoking history and fears regarding cancers and disapproval and dissatisfaction with other medical providers. Tried to redirect multiple times back to the subject she was here to address. Patient became very loud and aggressive when speaking to this Clinical research associate. I suggested her anxiety was causing her to her intrusive thoughts and 'rabbit hole'/deep dive into  internet searches which were then causing her to become more anxious because she was  finding worst case scenarios and possibly false information on social media that did not pertain to her. She became very upset and defensive when hearing this. "Anxiety is not causing me to do anything, I do not have anxiety. I want all the information and the why to everything." Patient does have documented anxiety but denies this and states she is not in therapy. I was backed into a spot between my exam table and the patient and did not feel I could easily/safely get out of the  exam room now could I reach my call button for assistance. Staff could here patient in the hallway and Raynelle Fanning, CMA came in to intervene and I was able to safely leave the room. Raynelle Fanning, CMA told patient the prescription she agreed to to manage her heavy bleeding would be sent to the pharmacy and she was escorted out of the exam room to check out.

## 2022-05-13 ENCOUNTER — Other Ambulatory Visit: Payer: Self-pay | Admitting: Internal Medicine

## 2022-05-13 ENCOUNTER — Encounter: Payer: Self-pay | Admitting: Diagnostic Radiology

## 2022-05-13 DIAGNOSIS — R928 Other abnormal and inconclusive findings on diagnostic imaging of breast: Secondary | ICD-10-CM

## 2022-05-14 ENCOUNTER — Ambulatory Visit
Admission: RE | Admit: 2022-05-14 | Discharge: 2022-05-14 | Disposition: A | Discharge status: Home / Self Care | Payer: 59 | Source: Ambulatory Visit | Attending: Internal Medicine | Admitting: Internal Medicine

## 2022-05-14 ENCOUNTER — Ambulatory Visit: Payer: 59

## 2022-05-14 DIAGNOSIS — R928 Other abnormal and inconclusive findings on diagnostic imaging of breast: Secondary | ICD-10-CM

## 2022-05-25 ENCOUNTER — Other Ambulatory Visit: Payer: 59

## 2022-05-28 ENCOUNTER — Telehealth: Payer: Self-pay

## 2022-05-28 NOTE — Telephone Encounter (Signed)
PA received from covermymeds for Slynd . KEY:  [id number] N92.0 Prior Authorization Completed.

## 2022-06-04 NOTE — Telephone Encounter (Signed)
PA denied.  The following alternative(s) is/are the preferred alternative(s): CAMILA OR DEBLITANE OR ERRIN OR HEATHER OR INCASSIA OR JENCYCLA OR LYLEQ OR LYZA OR NORETHINDRONE OR NORLYROC OR SHAROBEL.  MyChart message sent to patient with above information and asked her to let us know if she would like a new prescription sent to her pharmacy.

## 2022-06-11 ENCOUNTER — Telehealth: Payer: Self-pay | Admitting: Obstetrics and Gynecology

## 2022-06-11 NOTE — Telephone Encounter (Signed)
Phone call to patient to f/u on last OV.  Larkin Ina present for call.   Patient states last OV with Jami went well, no concerns. Will keep AEX as scheduled for 07/20/22.   Patient would like to f/u on alternative medication for [facility] as seen below, PA denied.  Advised Clearnce Hasten is out of the office, will review with covering provider for recommendations. Patient request Rx to [facility] on file.   Questions answered.   Dr. Edward Jolly -can you review and provide alternative Rx.

## 2022-06-11 NOTE — Telephone Encounter (Signed)
I recommend in office visit for determination of treatment option for patient.   I am unable to make this assessment without an office visit.  I would like a best care alternative for the patient.

## 2022-06-12 NOTE — Telephone Encounter (Signed)
Pt notified and voiced understanding. Pt is open to seeing BS for consult for alternate options for [facility]. Will send msg to appt desk to contact pt.

## 2022-06-12 NOTE — Telephone Encounter (Signed)
Per telephone encounter from BS: "I recommend in office visit for determination of treatment option for patient.    I am unable to make this assessment without an office visit.  I would like a best care alternative for the patient."  Will continue with that encounter and close this one.

## 2022-06-15 NOTE — Telephone Encounter (Signed)
FYI. Pt scheduled for 07/07/2022. Will route to provider for final review and close encounter.

## 2022-06-18 ENCOUNTER — Other Ambulatory Visit: Payer: Self-pay | Admitting: Orthopedic Surgery

## 2022-06-23 NOTE — Progress Notes (Signed)
GYNECOLOGY  VISIT   HPI: 42 y.o.   Single  African American  female   [id number] with Patient's last menstrual period was 06/19/2022.   here for   discuss BC options.  Prescribed Slynd, which she has not taken because she needed financial assistance for the medication.  States a code needs to be entered.   Prior smoker.  She is concerned about her history of smoking and her future risks of medical complications.   Pelvic US 04/30/22 showed small subserosal fibroid and possible adenomyosis.  Normal ovaries.   States dx PCOS due to her facial hair and acne.  States her testosterone level was high in the past: 12/04/21 with Dr. Sharl Ma 34.1 testosterone (normal range).  FSH 8.3, LH 4.8, prolactin 17.5, TFTS normal. on 08/19/21.   In the last 12 months, she has skipped an occasional cycle, for the first time since she was dx with PCOS many years ago.  Currently cycles are lighter than than in the past.  Cramping has lessened now that cycles are lighter.  Does not need pregnancy prevention.  Not sexually active for years.  Quit smoking.  Smoked for 20 years.   No migraine with aura, no HTN, no liver or breast disease, no personal hx of thromboembolic event.  Paternal grandfather died of circulation problem in his 30s.   Having surgery next week. Biopsy of her finger nail.  GYNECOLOGIC HISTORY: Patient's last menstrual period was 06/19/2022. Contraception:  JC prescribed Slynd Menopausal hormone therapy:  n/a Last mammogram:  05/14/22 Breast Density Cat B, BI-RADS CAT 1 neg Last pap smear:   unsure per pt, 6/22 scheduled for one        OB History     Gravida  5   Para  3   Term  3   Preterm      AB  2   Living  3      SAB  1   IAB  1   Ectopic      Multiple      Live Births                 Patient Active Problem List   Diagnosis Date Noted   Arm paresthesia, right 11/10/2018   Weakness 11/10/2018   Neck pain 11/10/2018    Past Medical History:  Diagnosis  Date   Anemia    Anxiety    Depression    Family history of adverse reaction to anesthesia    states her mother's "blood pressure dropped low"   GERD (gastroesophageal reflux disease)    Headache(784.0)    Hyperlipidemia    Kidney stone    Polycystic ovaries 05/15/2016   Thyroid condition     Past Surgical History:  Procedure Laterality Date   CESAREAN SECTION  2013    Current Outpatient Medications  Medication Sig Dispense Refill   albuterol (VENTOLIN HFA) 108 (90 Base) MCG/ACT inhaler as needed.     Bacillus Coagulans-Inulin (PROBIOTIC-PREBIOTIC PO) Take by mouth.     ferrous sulfate 325 (65 FE) MG tablet Take 325 mg by mouth daily.  0   Multiple Vitamin (MULTIVITAMIN) capsule Take 1 capsule by mouth daily.     polyethylene glycol powder (GLYCOLAX/MIRALAX) 17 GM/SCOOP powder Take 17 g by mouth 2 (two) times daily as needed. 3350 g 1   Turmeric (QC TUMERIC COMPLEX PO) Take by mouth.     VITAMIN D, ERGOCALCIFEROL, PO Take 1 tablet by mouth daily.     Drospirenone (  SLYND) 4 MG TABS Take 1 tablet (4 mg total) by mouth daily. (Patient not taking: Reported on 07/07/2022) 84 tablet 0   No current facility-administered medications for this visit.     ALLERGIES: Patient has no known allergies.  Family History  Problem Relation Age of Onset   Asthma Mother    Rashes / Skin problems Mother    Hypertension Mother    Colon polyps Mother    Hypertension Father    Heart disease Father    Breast cancer Maternal Aunt    Breast cancer Maternal Aunt    Breast cancer Maternal Aunt    Breast cancer Paternal Aunt    Breast cancer Paternal Aunt    Breast cancer Other    Colon cancer Neg Hx    Esophageal cancer Neg Hx    Stomach cancer Neg Hx    Rectal cancer Neg Hx     Social History   Socioeconomic History   Marital status: Single    Spouse name: Not on file   Number of children: 3   Years of education: 14   Highest education level: Not on file  Occupational History    Occupation: Unemployed  Tobacco Use   Smoking status: Former    Packs/day: .5    Types: Cigarettes    Start date: 12/03/2021    Quit date: 12/24/2021    Years since quitting: 0.5   Smokeless tobacco: Never  Vaping Use   Vaping Use: Never used  Substance and Sexual Activity   Alcohol use: Yes    Alcohol/week: 0.0 standard drinks of alcohol    Comment: occasional   Drug use: No   Sexual activity: Not Currently    Partners: Male    Comment: menarche 40yo, sexual debut 42yo  Other Topics Concern   Not on file  Social History Narrative   Lives at home with three children.   Right-handed.   1 cup coffee per day.   Social Determinants of Health   Financial Resource Strain: Low Risk  (03/19/2022)   Overall Financial Resource Strain (CARDIA)    Difficulty of Paying Living Expenses: Not hard at all  Food Insecurity: No Food Insecurity (03/19/2022)   Hunger Vital Sign    Worried About Running Out of Food in the Last Year: Never true    Ran Out of Food in the Last Year: Never true  Transportation Needs: No Transportation Needs (03/19/2022)   PRAPARE - Administrator, Civil Service (Medical): No    Lack of Transportation (Non-Medical): No  Physical Activity: Inactive (03/19/2022)   Exercise Vital Sign    Days of Exercise per Week: 0 days    Minutes of Exercise per Session: 0 min  Stress: Stress Concern Present (03/19/2022)   [employer] of Occupational Health - Occupational Stress Questionnaire    Feeling of Stress : Very much  Social Connections: Not on file  Intimate Partner Violence: Not on file    Review of Systems  All other systems reviewed and are negative.   PHYSICAL EXAMINATION:    BP 116/84 (BP Location: Left Arm, Patient Position: Sitting, Cuff Size: Normal)   Pulse (!) 113   Ht 5\' 4"  (1.626 m)   Wt 178 lb (80.7 kg)   LMP 06/19/2022   SpO2 97%   BMI 30.55 kg/m     General appearance: alert, cooperative and appears stated age    ASSESSMENT  Facial hair.  Acne.  Possible PCOS variant.  Former smoker.  PLAN  We reviewed PCOS.  Pelvic US reviewed.  We discussed treatment options of combined oral contraception, Slynd, and spironolactone.   Risks and benefits reviewed.  Patient will consider her options and contact me back with her choice.  55 min  total time was spent for this patient encounter, including preparation, face-to-face counseling with the patient, coordination of care, and documentation of the encounter.

## 2022-07-06 ENCOUNTER — Other Ambulatory Visit: Payer: Self-pay

## 2022-07-06 ENCOUNTER — Encounter (HOSPITAL_BASED_OUTPATIENT_CLINIC_OR_DEPARTMENT_OTHER): Payer: Self-pay | Admitting: Orthopedic Surgery

## 2022-07-07 ENCOUNTER — Encounter: Payer: Self-pay | Admitting: Obstetrics and Gynecology

## 2022-07-07 ENCOUNTER — Ambulatory Visit (INDEPENDENT_AMBULATORY_CARE_PROVIDER_SITE_OTHER): Payer: 59 | Admitting: Obstetrics and Gynecology

## 2022-07-07 VITALS — BP 116/84 | HR 113 | Ht 64.0 in | Wt 178.0 lb

## 2022-07-07 DIAGNOSIS — L680 Hirsutism: Secondary | ICD-10-CM | POA: Diagnosis not present

## 2022-07-07 DIAGNOSIS — L709 Acne, unspecified: Secondary | ICD-10-CM

## 2022-07-07 NOTE — Patient Instructions (Addendum)
Drospirenone Tablets What is this medication? DROSPIRENONE (dro SPY re nown) prevents ovulation and pregnancy. It belongs to a group of medications called contraceptives. This medication is a progestin hormone. This medicine may be used for other purposes; ask your health care provider or pharmacist if you have questions. COMMON BRAND NAME(S): Slynd What should I tell my care team before I take this medication? They need to know if you have any of these conditions: Abnormal vaginal bleeding Adrenal gland disease Blood vessel disease or blood clots Breast, cervical, endometrial, ovarian, liver, or uterine cancer Depression Diabetes Heart disease or recent heart attack High potassium level Kidney disease Liver disease Migraine headaches Stroke An unusual or allergic reaction to drospirenone, progestins, or other medications, foods, dyes, or preservatives Pregnant or trying to get pregnant Breast-feeding How should I use this medication? Take this medication by mouth. To reduce nausea, this medication may be taken with food. Follow the directions on the prescription label. Take this medication at the same time each day and in the order directed on the package. Do not take your medication more often than directed. A patient package insert for the product will be given with each prescription and refill. Read this sheet carefully each time. The sheet may change frequently. Talk to your care team about the use of this medication in children. Special care may be needed. Overdosage: If you think you have taken too much of this medicine contact a poison control center or emergency room at once. NOTE: This medicine is only for you. Do not share this medicine with others. What if I miss a dose? If you miss a dose, take it as soon as you can and refer to the patient information sheet you received with your medication for direction. If you miss more than one pill, this medication may not be as  effective, and you may need to use another form of contraception. What may interact with this medication? Do not take this medication with any of the following: Atazanavir; cobicistat Bosentan Fosamprenavir This medication may also interact with the following: Aprepitant Barbiturates, such as phenobarbital, primidone Carbamazepine Certain antibiotics, such as clarithromycin, rifampin, rifabutin, rifapentine Certain antivirals for HIV or hepatitis Certain diuretics, such as amiloride, spironolactone, triamterene Certain medications for blood pressure, heart disease Certain medications for fungal infections, such as griseofulvin, ketoconazole, itraconazole, voriconazole Cyclosporine Felbamate Heparin Medications for diabetes Modafinil NSAIDs, medications for pain and inflammation, such as ibuprofen or naproxen Oxcarbazepine Phenytoin Potassium supplements Rufinamide St. John's Wort Topiramate This list may not describe all possible interactions. Give your health care provider a list of all the medicines, herbs, non-prescription drugs, or dietary supplements you use. Also tell them if you smoke, drink alcohol, or use illegal drugs. Some items may interact with your medicine. What should I watch for while using this medication? Visit your care team for regular checks on your progress. You will need a regular breast and pelvic exam and Pap smear while on this medication. You may need blood work done while you are taking this medication. If you have any reason to think you are pregnant, stop taking this medication right away and contact your care team. This medication does not protect you against HIV or any other sexually transmitted infections (STIs). If you are going to have elective surgery, you may need to stop taking this medication before the surgery. Consult your care team for advice. What side effects may I notice from receiving this medication? Side effects that you should report  to  your care team as soon as possible: Allergic reactions--skin rash, itching, hives, swelling of the face, lips, tongue, or throat Blood clot--pain, swelling, or warmth in the leg, shortness of breath, chest pain Heart attack--pain or tightness in the chest, shoulders, arms, or jaw, nausea, shortness of breath, cold or clammy skin, feeling faint or lightheaded High potassium level--muscle weakness, fast or irregular heartbeat Liver injury--right upper belly pain, loss of appetite, nausea, light-colored stool, dark yellow or brown urine, yellowing skin or eyes, unusual weakness or fatigue Stroke--sudden numbness or weakness of the face, arm, or leg, trouble speaking, confusion, trouble walking, loss of balance or coordination, dizziness, severe headache, change in vision Worsening mood, feelings of depression Side effects that usually do not require medical attention (report to your care team if they continue or are bothersome): Acne Breast pain or tenderness Headache Irregular menstrual cycles or spotting Menstrual cramps Nausea Weight gain This list may not describe all possible side effects. Call your doctor for medical advice about side effects. You may report side effects to FDA at 1-800-FDA-1088. Where should I keep my medication? Keep out of the reach of children and pets. Store at room temperature between 20 and 25 degrees C (68 and 77 degrees F). Throw away any unused medication after the expiration date. NOTE: This sheet is a summary. It may not cover all possible information. If you have questions about this medicine, talk to your doctor, pharmacist, or health care provider.  2024 Elsevier/Gold Standard (2020-11-28 00:00:00)   Spironolactone Tablets What is this medication? SPIRONOLACTONE (speer on oh LAK tone) treats high blood pressure and heart failure. It may also be used to reduce swelling related to heart, kidney, or liver disease. It helps your kidneys remove more fluid  and salt from your blood through the urine without losing too much potassium. It belongs to a group of medications called diuretics. This medicine may be used for other purposes; ask your health care provider or pharmacist if you have questions. COMMON BRAND NAME(S): Aldactone What should I tell my care team before I take this medication? They need to know if you have any of these conditions: Addison's disease or low adrenal gland function High blood level of potassium Kidney disease Liver disease An unusual or allergic reaction to spironolactone, other medications, foods, dyes, or preservatives Pregnant or trying to get pregnant Breast-feeding How should I use this medication? Take this medication by mouth. Take it as directed on the prescription label at the same time every day. You can take it with or without food. You should always take it the same way. Keep taking it unless your care team tells you to stop. Talk to your care team about the use of this medication in children. Special care may be needed. Overdosage: If you think you have taken too much of this medicine contact a poison control center or emergency room at once. NOTE: This medicine is only for you. Do not share this medicine with others. What if I miss a dose? If you miss a dose, take it as soon as you can. If it is almost time for your next dose, take only that dose. Do not take double or extra doses. What may interact with this medication? Do not take this medication with any of the following: Cidofovir Eplerenone Tranylcypromine This medication may also interact with the following: Aspirin Certain medications for blood pressure or heart disease, such as benazepril, lisinopril, losartan, valsartan Certain medications that prevent or treat blood clots, such as  heparin and enoxaparin Cholestyramine Cyclosporine Digoxin Lithium Medications that relax muscles for surgery NSAIDs, medications for pain and inflammation,  such as ibuprofen or naproxen Other diuretics Potassium salts or supplements Steroid medications, such as prednisone or cortisone Trimethoprim This list may not describe all possible interactions. Give your health care provider a list of all the medicines, herbs, non-prescription drugs, or dietary supplements you use. Also tell them if you smoke, drink alcohol, or use illegal drugs. Some items may interact with your medicine. What should I watch for while using this medication? Visit your care team for regular checks on your progress. Check your blood pressure as directed. Know what your blood pressure should be and when to contact your care team. Do not treat yourself for coughs, colds, or pain while you are using this medication without asking your care team for advice. Some medications may increase your blood pressure. Check with your care team if you have severe diarrhea, nausea, and vomiting, or if you sweat a lot. The loss of too much body fluid may make it dangerous for you to take this medication. You may need to be on a special diet while you are taking this medication. Ask your care team. Also, find out how many glasses of fluids you need to drink each day. This medication may affect your coordination, reaction time, or judgment. Do not drive or operate machinery until you know how this medication affects you. Sit up or stand slowly to reduce the risk of dizzy or fainting spells. Drinking alcohol with this medication can increase the risk of these side effects. Avoid salt substitutes unless you are told otherwise by your care team. What side effects may I notice from receiving this medication? Side effects that you should report to your care team as soon as possible: Allergic reactions--skin rash, itching, hives, swelling of the face, lips, tongue, or throat Dehydration--increased thirst, dry mouth, feeling faint or lightheaded, headache, dark yellow or brown urine High potassium  level--muscle weakness, fast or irregular heartbeat Kidney injury--decrease in the amount of urine, swelling of the ankles, hands, or feet Low blood pressure--dizziness, feeling faint or lightheaded, blurry vision Low sodium level--muscle weakness, fatigue, dizziness, headache, confusion Side effects that usually do not require medical attention (report to your care team if they continue or are bothersome): Breast pain or tenderness Changes in sex drive or performance Dizziness Headache Irregular menstrual cycles or spotting Unexpected breast tissue growth This list may not describe all possible side effects. Call your doctor for medical advice about side effects. You may report side effects to FDA at 1-800-FDA-1088. Where should I keep my medication? Keep out of the reach of children and pets. Store below 25 degrees C (77 degrees F). Get rid of any unused medication after the expiration date. To get rid of medications that are no longer needed or have expired: Take the medication to a medication take-back program. Check with your pharmacy or law enforcement to find a location. If you cannot return the medication, check the label or package insert to see if the medication should be thrown out in the garbage or flushed down the toilet. If you are not sure, ask your care team. If it is safe to put into the trash, take the medication out of the container. Mix the medication with cat litter, dirt, coffee grounds, or other unwanted substance. Seal the mixture in a bag or container. Put it in the trash. NOTE: This sheet is a summary. It may not cover all  possible information. If you have questions about this medicine, talk to your doctor, pharmacist, or health care provider.  2024 Elsevier/Gold Standard (2021-08-07 00:00:00)

## 2022-07-08 ENCOUNTER — Telehealth: Payer: Self-pay | Admitting: Licensed Clinical Social Worker

## 2022-07-08 ENCOUNTER — Encounter: Payer: Self-pay | Admitting: Licensed Clinical Social Worker

## 2022-07-08 NOTE — Patient Outreach (Signed)
  Care Coordination   07/08/2022 Name: Jaclyn Macdonald MRN: [id number] DOB: [birth year]   Care Coordination Outreach Attempts:  An unsuccessful telephone outreach was attempted for a scheduled appointment today.  Follow Up Plan:  Additional outreach attempts will be made to offer the patient care coordination information and services.   Encounter Outcome:  No Answer   Care Coordination Interventions:  No, not indicated    Jenel Lucks, MSW, LCSW [facility] [place]  [facility] [email or web address] Phone [phone] 11:20 AM

## 2022-07-13 ENCOUNTER — Ambulatory Visit (HOSPITAL_BASED_OUTPATIENT_CLINIC_OR_DEPARTMENT_OTHER): Payer: 59 | Admitting: Anesthesiology

## 2022-07-13 ENCOUNTER — Encounter (HOSPITAL_BASED_OUTPATIENT_CLINIC_OR_DEPARTMENT_OTHER)
Admission: RE | Disposition: A | Discharge status: Home / Self Care | Payer: Self-pay | Source: Home / Self Care | Attending: Orthopedic Surgery

## 2022-07-13 ENCOUNTER — Encounter (HOSPITAL_BASED_OUTPATIENT_CLINIC_OR_DEPARTMENT_OTHER): Payer: Self-pay | Admitting: Orthopedic Surgery

## 2022-07-13 ENCOUNTER — Ambulatory Visit (HOSPITAL_BASED_OUTPATIENT_CLINIC_OR_DEPARTMENT_OTHER)
Admission: RE | Admit: 2022-07-13 | Discharge: 2022-07-13 | Disposition: A | Discharge status: Home / Self Care | Payer: 59 | Attending: Orthopedic Surgery | Admitting: Orthopedic Surgery

## 2022-07-13 DIAGNOSIS — Z01818 Encounter for other preprocedural examination: Secondary | ICD-10-CM

## 2022-07-13 DIAGNOSIS — L608 Other nail disorders: Secondary | ICD-10-CM

## 2022-07-13 DIAGNOSIS — Z87891 Personal history of nicotine dependence: Secondary | ICD-10-CM

## 2022-07-13 DIAGNOSIS — L03011 Cellulitis of right finger: Secondary | ICD-10-CM | POA: Diagnosis present

## 2022-07-13 DIAGNOSIS — F418 Other specified anxiety disorders: Secondary | ICD-10-CM | POA: Diagnosis not present

## 2022-07-13 HISTORY — PX: NAILBED REPAIR: SHX5028

## 2022-07-13 HISTORY — DX: Family history of other specified conditions: Z84.89

## 2022-07-13 LAB — POCT PREGNANCY, URINE: Preg Test, Ur: NEGATIVE

## 2022-07-13 SURGERY — REPAIR, NAIL BED
Anesthesia: Monitor Anesthesia Care | Site: Middle Finger | Laterality: Right

## 2022-07-13 MED ORDER — OXYCODONE HCL 5 MG PO TABS
ORAL_TABLET | ORAL | Status: AC
  Filled 2022-07-13: qty 1

## 2022-07-13 MED ORDER — MEPERIDINE HCL 25 MG/ML IJ SOLN: 6.2500 mg | INTRAMUSCULAR | Status: DC | PRN

## 2022-07-13 MED ORDER — HYDROMORPHONE HCL 1 MG/ML IJ SOLN: 0.2500 mg | INTRAMUSCULAR | Status: DC | PRN

## 2022-07-13 MED ORDER — OXYCODONE HCL 5 MG PO TABS
5.0000 mg | ORAL_TABLET | Freq: Once | ORAL | Status: AC | PRN
  Administered 2022-07-13: 5 mg via ORAL

## 2022-07-13 MED ORDER — GLYCOPYRROLATE PF 0.2 MG/ML IJ SOSY
PREFILLED_SYRINGE | INTRAMUSCULAR | Status: AC
  Filled 2022-07-13: qty 1

## 2022-07-13 MED ORDER — ONDANSETRON HCL 4 MG/2ML IJ SOLN: 4.0000 mg | Freq: Once | INTRAMUSCULAR | Status: DC | PRN

## 2022-07-13 MED ORDER — MIDAZOLAM HCL 2 MG/2ML IJ SOLN
INTRAMUSCULAR | Status: AC
  Filled 2022-07-13: qty 2

## 2022-07-13 MED ORDER — MIDAZOLAM HCL 2 MG/2ML IJ SOLN
INTRAMUSCULAR | Status: DC | PRN
  Administered 2022-07-13: 2 mg via INTRAVENOUS

## 2022-07-13 MED ORDER — ONDANSETRON HCL 4 MG/2ML IJ SOLN
INTRAMUSCULAR | Status: AC
  Filled 2022-07-13: qty 12

## 2022-07-13 MED ORDER — BUPIVACAINE HCL (PF) 0.25 % IJ SOLN
INTRAMUSCULAR | Status: AC
  Filled 2022-07-13: qty 30

## 2022-07-13 MED ORDER — GLYCOPYRROLATE 0.2 MG/ML IJ SOLN
INTRAMUSCULAR | Status: DC | PRN
  Administered 2022-07-13: .2 mg via INTRAVENOUS

## 2022-07-13 MED ORDER — LACTATED RINGERS IV SOLN: INTRAVENOUS | Status: DC

## 2022-07-13 MED ORDER — BUPIVACAINE HCL (PF) 0.25 % IJ SOLN
INTRAMUSCULAR | Status: DC | PRN
  Administered 2022-07-13: 9 mL

## 2022-07-13 MED ORDER — ONDANSETRON HCL 4 MG/2ML IJ SOLN
INTRAMUSCULAR | Status: DC | PRN
  Administered 2022-07-13: 4 mg via INTRAVENOUS

## 2022-07-13 MED ORDER — PROPOFOL 10 MG/ML IV BOLUS
INTRAVENOUS | Status: DC | PRN
  Administered 2022-07-13 (×2): 30 mg via INTRAVENOUS
  Administered 2022-07-13 (×2): 20 mg via INTRAVENOUS
  Administered 2022-07-13 (×2): 30 mg via INTRAVENOUS
  Administered 2022-07-13: 100 mg via INTRAVENOUS

## 2022-07-13 MED ORDER — ACETAMINOPHEN 500 MG PO TABS: 1000.0000 mg | ORAL_TABLET | Freq: Once | ORAL | Status: DC

## 2022-07-13 MED ORDER — CEFAZOLIN SODIUM-DEXTROSE 2-3 GM-%(50ML) IV SOLR
INTRAVENOUS | Status: DC | PRN
  Administered 2022-07-13: 2 g via INTRAVENOUS

## 2022-07-13 MED ORDER — OXYCODONE HCL 5 MG/5ML PO SOLN: 5.0000 mg | Freq: Once | ORAL | Status: AC | PRN

## 2022-07-13 MED ORDER — AMISULPRIDE (ANTIEMETIC) 5 MG/2ML IV SOLN: 10.0000 mg | Freq: Once | INTRAVENOUS | Status: DC | PRN

## 2022-07-13 MED ORDER — KETOROLAC TROMETHAMINE 30 MG/ML IJ SOLN: 30.0000 mg | Freq: Once | INTRAMUSCULAR | Status: DC | PRN

## 2022-07-13 MED ORDER — ACETAMINOPHEN 500 MG PO TABS
ORAL_TABLET | ORAL | Status: AC
  Filled 2022-07-13: qty 2

## 2022-07-13 MED ORDER — TRAMADOL HCL 50 MG PO TABS: ORAL_TABLET | ORAL | 0 refills | Status: DC

## 2022-07-13 SURGICAL SUPPLY — 63 items
APL PRP STRL LF DISP 70% ISPRP (MISCELLANEOUS) ×2
APL SKNCLS STERI-STRIP NONHPOA (GAUZE/BANDAGES/DRESSINGS)
BANDAGE GAUZE 1X75IN STRL (MISCELLANEOUS) IMPLANT
BENZOIN TINCTURE PRP APPL 2/3 (GAUZE/BANDAGES/DRESSINGS) IMPLANT
BLADE MINI RND TIP GREEN BEAV (BLADE) ×3 IMPLANT
BLADE SURG 15 STRL LF DISP TIS (BLADE) ×6 IMPLANT
BLADE SURG 15 STRL SS (BLADE) ×4
BNDG CMPR 5X2 CHSV 1 LYR STRL (GAUZE/BANDAGES/DRESSINGS)
BNDG CMPR 5X2 KNTD ELC UNQ LF (GAUZE/BANDAGES/DRESSINGS)
BNDG CMPR 5X3 KNIT ELC UNQ LF (GAUZE/BANDAGES/DRESSINGS)
BNDG CMPR 75X11 PLY HI ABS (MISCELLANEOUS)
BNDG CMPR 75X21 PLY HI ABS (MISCELLANEOUS)
BNDG CMPR 9X4 STRL LF SNTH (GAUZE/BANDAGES/DRESSINGS)
BNDG COHESIVE 1X5 TAN STRL LF (GAUZE/BANDAGES/DRESSINGS) ×3 IMPLANT
BNDG COHESIVE 2X5 TAN ST LF (GAUZE/BANDAGES/DRESSINGS) IMPLANT
BNDG ELASTIC 2INX 5YD STR LF (GAUZE/BANDAGES/DRESSINGS) IMPLANT
BNDG ELASTIC 3INX 5YD STR LF (GAUZE/BANDAGES/DRESSINGS) IMPLANT
BNDG ESMARK 4X9 LF (GAUZE/BANDAGES/DRESSINGS) IMPLANT
BNDG GAUZE 1X75IN STRL (MISCELLANEOUS)
BNDG GAUZE DERMACEA FLUFF 4 (GAUZE/BANDAGES/DRESSINGS) IMPLANT
BNDG GZE DERMACEA 4 6PLY (GAUZE/BANDAGES/DRESSINGS)
BNDG PLASTER X FAST 3X3 WHT LF (CAST SUPPLIES) IMPLANT
BNDG PLSTR 9X3 FST ST WHT (CAST SUPPLIES)
CHLORAPREP W/TINT 26 (MISCELLANEOUS) ×3 IMPLANT
CORD BIPOLAR FORCEPS 12FT (ELECTRODE) ×3 IMPLANT
COVER BACK TABLE 60X90IN (DRAPES) ×3 IMPLANT
COVER MAYO STAND STRL (DRAPES) ×3 IMPLANT
CUFF TOURN SGL QUICK 18X4 (TOURNIQUET CUFF) ×3 IMPLANT
DRAPE EXTREMITY T 121X128X90 (DISPOSABLE) ×3 IMPLANT
DRAPE SURG 17X23 STRL (DRAPES) ×3 IMPLANT
GAUZE SPONGE 4X4 12PLY STRL (GAUZE/BANDAGES/DRESSINGS) ×3 IMPLANT
GAUZE STRETCH 2X75IN STRL (MISCELLANEOUS) IMPLANT
GAUZE XEROFORM 1X8 LF (GAUZE/BANDAGES/DRESSINGS) ×3 IMPLANT
GLOVE BIO SURGEON STRL SZ7.5 (GLOVE) ×3 IMPLANT
GLOVE BIOGEL PI IND STRL 8 (GLOVE) ×3 IMPLANT
GOWN STRL REUS W/ TWL LRG LVL3 (GOWN DISPOSABLE) ×3 IMPLANT
GOWN STRL REUS W/ TWL XL LVL3 (GOWN DISPOSABLE) ×3 IMPLANT
GOWN STRL REUS W/TWL LRG LVL3 (GOWN DISPOSABLE) ×2
GOWN STRL REUS W/TWL XL LVL3 (GOWN DISPOSABLE) ×5 IMPLANT
NDL HYPO 25X1 1.5 SAFETY (NEEDLE) ×2 IMPLANT
NEEDLE HYPO 25X1 1.5 SAFETY (NEEDLE) ×2 IMPLANT
NS IRRIG 1000ML POUR BTL (IV SOLUTION) ×3 IMPLANT
PACK BASIN DAY SURGERY FS (CUSTOM PROCEDURE TRAY) ×3 IMPLANT
PAD CAST 3X4 CTTN HI CHSV (CAST SUPPLIES) IMPLANT
PAD CAST 4YDX4 CTTN HI CHSV (CAST SUPPLIES) IMPLANT
PADDING CAST ABS COTTON 4X4 ST (CAST SUPPLIES) ×3 IMPLANT
PADDING CAST COTTON 3X4 STRL (CAST SUPPLIES)
PADDING CAST COTTON 4X4 STRL (CAST SUPPLIES)
STOCKINETTE 4X48 STRL (DRAPES) ×3 IMPLANT
STRIP CLOSURE SKIN 1/2X4 (GAUZE/BANDAGES/DRESSINGS) IMPLANT
SUT CHROMIC 5 0 P 3 (SUTURE) IMPLANT
SUT CHROMIC 6 0 PS 4 (SUTURE) ×1 IMPLANT
SUT ETHILON 3 0 PS 1 (SUTURE) IMPLANT
SUT ETHILON 4 0 PS 2 18 (SUTURE) ×3 IMPLANT
SUT MON AB 5-0 P3 18 (SUTURE) IMPLANT
SUT NYLON ETHILON 5-0 P-3 1X18 (SUTURE) IMPLANT
SUT VIC AB 4-0 P2 18 (SUTURE) IMPLANT
SUT VIC AB 5-0 P-3 18X BRD (SUTURE) IMPLANT
SUT VIC AB 5-0 P3 18 (SUTURE)
SYR BULB EAR ULCER 3OZ GRN STR (SYRINGE) ×3 IMPLANT
SYR CONTROL 10ML LL (SYRINGE) ×3 IMPLANT
TOWEL GREEN STERILE FF (TOWEL DISPOSABLE) ×6 IMPLANT
UNDERPAD 30X36 HEAVY ABSORB (UNDERPADS AND DIAPERS) ×3 IMPLANT

## 2022-07-13 NOTE — H&P (Signed)
Jaclyn Macdonald is an 42 y.o. female.   Chief Complaint: nail discoloration HPI: 42 yo female with discoloration under nail of right long finger.  Present approximately one year.  She notes nail thickening and fragility.  She wishes to proceed with nail unit biopsy.  Allergies: No Known Allergies  Past Medical History:  Diagnosis Date   Anemia    Anxiety    Depression    Family history of adverse reaction to anesthesia    states her mother's "blood pressure dropped low"   GERD (gastroesophageal reflux disease)    Headache(784.0)    Hyperlipidemia    Kidney stone    Polycystic ovaries 05/15/2016   Thyroid condition     Past Surgical History:  Procedure Laterality Date   CESAREAN SECTION  2013    Family History: Family History  Problem Relation Age of Onset   Asthma Mother    Rashes / Skin problems Mother    Hypertension Mother    Colon polyps Mother    Hypertension Father    Heart disease Father    Breast cancer Maternal Aunt    Breast cancer Maternal Aunt    Breast cancer Maternal Aunt    Breast cancer Paternal Aunt    Breast cancer Paternal Aunt    Breast cancer Other    Colon cancer Neg Hx    Esophageal cancer Neg Hx    Stomach cancer Neg Hx    Rectal cancer Neg Hx     Social History:   reports that she quit smoking about 6 months ago. Her smoking use included cigarettes. She started smoking about 7 months ago. She smoked an average of .5 packs per day. She has never used smokeless tobacco. She reports current alcohol use. She reports that she does not use drugs.  Medications: Medications Prior to Admission  Medication Sig Dispense Refill   Bacillus Coagulans-Inulin (PROBIOTIC-PREBIOTIC PO) Take by mouth.     ferrous sulfate 325 (65 FE) MG tablet Take 325 mg by mouth daily.  0   Multiple Vitamin (MULTIVITAMIN) capsule Take 1 capsule by mouth daily.     Turmeric (QC TUMERIC COMPLEX PO) Take by mouth.     VITAMIN D, ERGOCALCIFEROL, PO Take 1 tablet by mouth  daily.     albuterol (VENTOLIN HFA) 108 (90 Base) MCG/ACT inhaler as needed.     Drospirenone (SLYND) 4 MG TABS Take 1 tablet (4 mg total) by mouth daily. (Patient not taking: Reported on 07/07/2022) 84 tablet 0   polyethylene glycol powder (GLYCOLAX/MIRALAX) 17 GM/SCOOP powder Take 17 g by mouth 2 (two) times daily as needed. 3350 g 1    Results for orders placed or performed during the hospital encounter of 07/13/22 (from the past 48 hour(s))  Pregnancy, urine POC     Status: None   Collection Time: 07/13/22 11:12 AM  Result Value Ref Range   Preg Test, Ur NEGATIVE NEGATIVE    Comment:        THE SENSITIVITY OF THIS METHODOLOGY IS >24 mIU/mL     No results found.    Blood pressure 118/79, pulse 76, temperature 98.1 F (36.7 C), temperature source Oral, resp. rate 20, height 5\' 4"  (1.626 m), weight 80.5 kg, last menstrual period 06/19/2022, SpO2 100 %.  General appearance: alert, cooperative, and appears stated age Head: Normocephalic, without obvious abnormality, atraumatic Neck: supple, symmetrical, trachea midline Extremities: Intact sensation and capillary refill all digits.  +epl/fpl/io.  No wounds. Discoloration at radial side of right long fingernail  Pulses: 2+ and symmetric Skin: Skin color, texture, turgor normal. No rashes or lesions Neurologic: Grossly normal Incision/Wound: none  Assessment/Plan Right long fingernail discoloration.  Plan nail unit biopsy.  Non operative and operative treatment options have been discussed with the patient and patient wishes to proceed with operative treatment. Risks, benefits and alternatives of surgery were discussed including risks of blood loss, infection, damage to nerves/vessels/tendons/ligament/bone, failure of surgery, need for additional surgery, complication with wound healing, stiffness.  She voiced understanding of these risks and elected to proceed.    Betha Loa 07/13/2022, 1:04 PM

## 2022-07-13 NOTE — Anesthesia Postprocedure Evaluation (Signed)
Anesthesia Post Note  Patient: Kyisha Fowle  Procedure(s) Performed: NAIL/NAILBED BIOPSY RIGHT MIDDLE FINGER (Right: Middle Finger)     Patient location during evaluation: PACU Anesthesia Type: MAC Level of consciousness: awake and alert Pain management: pain level controlled Vital Signs Assessment: post-procedure vital signs reviewed and stable Respiratory status: spontaneous breathing, nonlabored ventilation and respiratory function stable Cardiovascular status: blood pressure returned to baseline and stable Postop Assessment: no apparent nausea or vomiting Anesthetic complications: no   No notable events documented.  Last Vitals:  Vitals:   07/13/22 1415 07/13/22 1501  BP: 111/79 120/83  Pulse: 78 66  Resp: 15 18  Temp:  (!) 36.1 C  SpO2: 93% 100%    Last Pain:  Vitals:   07/13/22 1415  TempSrc:   PainSc: 3                  Lannie Fields

## 2022-07-13 NOTE — Op Note (Signed)
NAME: Jaclyn Macdonald MEDICAL RECORD NO: [id number] DATE OF BIRTH: 1980/04/12 FACILITY: [facility] LOCATION: [facility] PHYSICIAN: Tami Ribas, MD   OPERATIVE REPORT   DATE OF PROCEDURE: 07/13/22    PREOPERATIVE DIAGNOSIS: Right long finger melanonychia   POSTOPERATIVE DIAGNOSIS: Right Long finger melanonychia   PROCEDURE: Right long finger nail unit biopsy   SURGEON:  Betha Loa, M.D.   ASSISTANT: none   ANESTHESIA:  Local with sedation   INTRAVENOUS FLUIDS:  Per anesthesia flow sheet.   ESTIMATED BLOOD LOSS:  Minimal.   COMPLICATIONS:  None.   SPECIMENS: Right long finger nail unit pathology   TOURNIQUET TIME:    Total Tourniquet Time Documented: Forearm (Right) - 22 minutes Total: Forearm (Right) - 22 minutes    DISPOSITION:  Stable to PACU.   INDICATIONS: 42 year old female has noticed a discoloration of the right long finger nail for approximately 1 year.  She wishes to proceed with right long finger nail unit biopsy.  Risks, benefits and alternatives of surgery were discussed including the risks of blood loss, infection, damage to nerves, vessels, tendons, ligaments, bone for surgery, need for additional surgery, complications with wound healing, continued pain, stiffness, nail deformity.  She voiced understanding of these risks and elected to proceed.  OPERATIVE COURSE:  After being identified preoperatively by myself,  the patient and I agreed on the procedure and site of the procedure.  The surgical site was marked.  Surgical consent had been signed. Preoperative IV antibiotic prophylaxis was given. She was transferred to the operating room and placed on the operating table in supine position with the Right upper extremity on an arm board.  Sedation was induced by the anesthesiologist.a surgical pause was performed between the surgeons anesthesia and operating room staff and all were in agreement as to the patient procedure and site procedure.   Digital block was performed to the right long finger with quarter percent plain Marcaine.  Right upper extremity was prepped and draped in normal sterile orthopedic fashion.  A surgical pause was performed between the surgeons, anesthesia, and operating room staff and all were in agreement as to the patient, procedure, and site of procedure.  Tourniquet at the proximal aspect of the forearm was inflated to 250 mmHg after exsanguination of the arm with an Esmarch bandage.  The nail was removed with a freer elevator.  There was a discoloration of the nailbed just underneath the nail fold.  The nail unit biopsy was performed taking approximate 1-1/2 mm with the nail unit from hyponychium through to the nail fold including the dorsal roof.  The entirety of the discolored nailbed was removed.  The nail unit was sent to pathology for examination.  The adjacent soft tissues and nailbed were mobilized with a Beaver blade.  They were able to be reapproximated.  6-0 chromic suture was used to reapproximate the nailbed tissue in both a interrupted and horizontal mattress technique.  The skin edges were also reapproximated with the 6-0 chromic suture.  A piece of Xeroform was placed in nail fold and the wound dressed with sterile Xeroform 4 x 4 and wrapped with a Coban dressing lightly.  An AlumaFoam splint was placed and wrapped lightly with Coban dressing.  The tourniquet was deflated at 22 minutes.  Fingertips were pink with brisk capillary refill after deflation of tourniquet.  The operative  drapes were broken down.  The patient was awoken from anesthesia safely.  She was transferred back to the stretcher and taken  to PACU in stable condition.  I will see her back in the office in 1 week for postoperative followup.  I will give her a prescription for Tramadol 50 mg 1-2 tabs PO q6 hours prn pain, dispense # 20.   Betha Loa, MD Electronically signed, 07/13/22

## 2022-07-13 NOTE — Discharge Instructions (Addendum)

## 2022-07-13 NOTE — Anesthesia Preprocedure Evaluation (Addendum)
Anesthesia Evaluation  Patient identified by MRN, date of birth, ID band Patient awake    Reviewed: Allergy & Precautions, NPO status , Patient's Chart, lab work & pertinent test results  Airway Mallampati: I  TM Distance: >3 FB Neck ROM: Full    Dental no notable dental hx. (+) Teeth Intact, Dental Advisory Given   Pulmonary former smoker Quit smoking 12/2021   Pulmonary exam normal breath sounds clear to auscultation       Cardiovascular negative cardio ROS Normal cardiovascular exam(-) Valvular Problems/Murmurs Rhythm:Regular Rate:Normal     Neuro/Psych  Headaches PSYCHIATRIC DISORDERS Anxiety Depression       GI/Hepatic Neg liver ROS,GERD  Controlled,,  Endo/Other  negative endocrine ROS    Renal/GU negative Renal ROS  negative genitourinary   Musculoskeletal negative musculoskeletal ROS (+)    Abdominal   Peds  Hematology negative hematology ROS (+)   Anesthesia Other Findings   Reproductive/Obstetrics negative OB ROS                             Anesthesia Physical Anesthesia Plan  ASA: 2  Anesthesia Plan: MAC   Post-op Pain Management: Tylenol PO (pre-op)*   Induction:   PONV Risk Score and Plan: 2 and Propofol infusion and TIVA  Airway Management Planned: Natural Airway and Simple Face Mask  Additional Equipment: None  Intra-op Plan:   Post-operative Plan:   Informed Consent: I have reviewed the patients History and Physical, chart, labs and discussed the procedure including the risks, benefits and alternatives for the proposed anesthesia with the patient or authorized representative who has indicated his/her understanding and acceptance.       Plan Discussed with: CRNA  Anesthesia Plan Comments:        Anesthesia Quick Evaluation

## 2022-07-13 NOTE — Transfer of Care (Signed)
Immediate Anesthesia Transfer of Care Note  Patient: Jaclyn Macdonald  Procedure(s) Performed: NAIL/NAILBED BIOPSY RIGHT MIDDLE FINGER (Right: Middle Finger)  Patient Location: PACU  Anesthesia Type:MAC  Level of Consciousness: awake and patient cooperative  Airway & Oxygen Therapy: Patient Spontanous Breathing  Post-op Assessment: Report given to RN and Post -op Vital signs reviewed and stable  Post vital signs: Reviewed and stable  Last Vitals:  Vitals Value Taken Time  BP 98/83   Temp    Pulse 77   Resp 14   SpO2 98     Last Pain:  Vitals:   07/13/22 1128  TempSrc: Oral  PainSc: 0-No pain         Complications: No notable events documented.

## 2022-07-14 ENCOUNTER — Encounter (HOSPITAL_BASED_OUTPATIENT_CLINIC_OR_DEPARTMENT_OTHER): Payer: Self-pay | Admitting: Orthopedic Surgery

## 2022-07-15 LAB — SURGICAL PATHOLOGY

## 2022-07-20 ENCOUNTER — Encounter: Payer: Self-pay | Admitting: Radiology

## 2022-07-20 ENCOUNTER — Other Ambulatory Visit (HOSPITAL_COMMUNITY)
Admission: RE | Admit: 2022-07-20 | Discharge: 2022-07-20 | Disposition: A | Discharge status: Home / Self Care | Payer: 59 | Source: Ambulatory Visit | Attending: Radiology | Admitting: Radiology

## 2022-07-20 ENCOUNTER — Ambulatory Visit (INDEPENDENT_AMBULATORY_CARE_PROVIDER_SITE_OTHER): Payer: 59 | Admitting: Radiology

## 2022-07-20 VITALS — BP 116/82 | Ht 64.5 in | Wt 174.0 lb

## 2022-07-20 DIAGNOSIS — Z1151 Encounter for screening for human papillomavirus (HPV): Secondary | ICD-10-CM | POA: Diagnosis not present

## 2022-07-20 DIAGNOSIS — Z01419 Encounter for gynecological examination (general) (routine) without abnormal findings: Secondary | ICD-10-CM

## 2022-07-20 NOTE — Progress Notes (Signed)
   Jaclyn Macdonald 12-May-1980 [id number]   History:  42 y.o. G5P3 presents for annual exam.No new gyn concerns.  Gynecologic History Patient's last menstrual period was 07/14/2022 (exact date). Period Pattern: (!) Irregular Menstrual Control: Maxi pad, Thin pad Dysmenorrhea: (!) Severe Dysmenorrhea Symptoms: Nausea, Cramping Contraception/Family planning: abstinence Sexually active: no Last Pap: 2020. Results were: normal Last mammogram: 05/2022. Results were: normal after follow up imaging of left  Obstetric History OB History  Gravida Para Term Preterm AB Living  5 3 3   2 3   SAB IAB Ectopic Multiple Live Births  1 1          # Outcome Date GA Lbr Len/2nd Weight Sex Delivery Anes PTL Lv  5 Term           4 Term           3 Term           2 SAB           1 IAB              The following portions of the patient's history were reviewed and updated as appropriate: allergies, current medications, past family history, past medical history, past social history, past surgical history, and problem list.  Review of Systems Pertinent items noted in HPI and remainder of comprehensive ROS otherwise negative.   Past medical history, past surgical history, family history and social history were all reviewed and documented in the EPIC chart.   Exam:  Vitals:   07/20/22 1005  BP: 116/82  Weight: 174 lb (78.9 kg)  Height: 5' 4.5" (1.638 m)   Body mass index is 29.41 kg/m.  General appearance:  Normal Thyroid:  Symmetrical, normal in size, without palpable masses or nodularity. Respiratory  Auscultation:  Clear without wheezing or rhonchi Cardiovascular  Auscultation:  Regular rate, without rubs, murmurs or gallops  Edema/varicosities:  Not grossly evident Abdominal  Soft,nontender, without masses, guarding or rebound.  Liver/spleen:  No organomegaly noted  Hernia:  None appreciated  Skin  Inspection:  Grossly normal Breasts: Examined lying and sitting.   Right: Without  masses, retractions, nipple discharge or axillary adenopathy.   Left: Without masses, retractions, nipple discharge or axillary adenopathy. Genitourinary   Inguinal/mons:  Normal without inguinal adenopathy  External genitalia:  Normal appearing vulva with no masses, tenderness, or lesions  BUS/Urethra/Skene's glands:  Normal without masses or exudate  Vagina:  Normal appearing with normal color and discharge, no lesions  Cervix:  Normal appearing without discharge or lesions  Uterus:  Normal in size, shape and contour.  Mobile, nontender  Adnexa/parametria:     Rt: Normal in size, without masses or tenderness.   Lt: Normal in size, without masses or tenderness.  Anus and perineum: Normal   Jaclyn Macdonald, CMA present for exam  Assessment/Plan:   1. Well woman exam with routine gynecological exam - Cytology - PAP( [place])     Discussed SBE, colonoscopy and DEXA screening as directed/appropriate. Recommend of exercise weekly, including weight bearing exercise. Encouraged the use of seatbelts and sunscreen. Return in 1 year for annual or as needed.   Jaclyn Macdonald B WHNP-BC 10:22 AM 07/20/2022

## 2022-07-21 LAB — CYTOLOGY - PAP
Comment: NEGATIVE
Diagnosis: NEGATIVE
High risk HPV: NEGATIVE

## 2023-08-09 ENCOUNTER — Ambulatory Visit (INDEPENDENT_AMBULATORY_CARE_PROVIDER_SITE_OTHER): Admitting: Gastroenterology

## 2023-08-09 VITALS — BP 122/68 | HR 76 | Ht 64.0 in | Wt 195.0 lb

## 2023-08-09 DIAGNOSIS — K625 Hemorrhage of anus and rectum: Secondary | ICD-10-CM | POA: Diagnosis not present

## 2023-08-09 DIAGNOSIS — K648 Other hemorrhoids: Secondary | ICD-10-CM

## 2023-08-09 NOTE — Patient Instructions (Addendum)
 Continue high fiber diet OTC Metamucil po daily   You have been scheduled for a hemorrhoid banding appointment on Tuesday, 08-17-23 at 11:10am with Dr. Federico (please go to the 2nd floor). Please arrive 10 minutes early for registration. If you need to reschedule or cancel this appointment please call [phone] as soon as possible. Thank you.   Thank you for entrusting me with your care and for choosing [facility], Deanna May, NP   If your blood pressure at your visit was 140/90 or greater, please contact your primary care physician to follow up on this. ______________________________________________________  If you are age 38 or older, your body mass index should be between 23-30. Your Body mass index is 33.47 kg/m. If this is out of the aforementioned range listed, please consider follow up with your Primary Care Provider.  If you are age 23 or younger, your body mass index should be between 19-25. Your Body mass index is 33.47 kg/m. If this is out of the aformentioned range listed, please consider follow up with your Primary Care Provider.  ________________________________________________________  The [facility] providers would like to encourage you to use MYCHART to communicate with providers for non-urgent requests or questions.  Due to long hold times on the telephone, sending your provider a message by [facility] may be a faster and more efficient way to get a response.  Please allow 48 business hours for a response.  Please remember that this is for non-urgent requests.  _______________________________________________________  Due to recent changes in healthcare laws, you may see the results of your imaging and laboratory studies on MyChart before your provider has had a chance to review them.  We understand that in some cases there may be results that are confusing or concerning to you. Not all laboratory results come back in the same time frame and the provider may be waiting  for multiple results in order to interpret others.  Please give us  48 hours in order for your provider to thoroughly review all the results before contacting the office for clarification of your results.

## 2023-08-09 NOTE — Progress Notes (Signed)
 Chief Complaint:blood in stool, internal hemorrhoids  Primary GI Doctor:(previously Dr. Aneita) Dr. Federico  HPI:  Patient is a  43 year old female patient with past medical history of iron deficiency anemia secondary to menorrhagia, GERD, hyperlipidemia, anxiety, and depression, who was self referred to me for a complaint of blood in stool .   Patient last seen in the GI office by Dr. Aneita on 02/09/22.  Patient had colonoscopy on 04/15/22 for personal history of adenomatous polyps on last colonoscopy 3 years ago, hematochezia.  Interval History    Patient presents for evaluation of internal hemorrhoids. She reports she has intermittent rectal bleeding. She will pass gas at times and pass blood. Her last episode was first of June. She used OTC Prep H and sitz baths which helped some, but was concerned because it lasted longer than usual. She states it bled for about 4 days.  She has bowel movement every 1-2 days. She tries to incorporate more fiber and drink plenty of fluids. She has taken OTC Metamucil, but admits she has not been taking it on regular basis. She walks 7 miles per day.  Previous GI procedures:  Colonoscopy March 2024, recall 5 years - Internal hemorrhoids.  - The examination was otherwise normal on direct and retroflexion views.  - No specimens collected.  Colonoscopy Mar 2021 - One 12 mm polyp in the sigmoid colon, removed with a hot snare. Resected and retrieved.  - Internal hemorrhoids. - The examination was otherwise normal on direct and retroflexion views. Path: tubular adenoma   EGD Mar 2021 - Normal esophagus. - Erosive gastropathy with no bleeding and no stigmata of recent bleeding. Biopsied. - Erythematous mucosa in the stomach. Biopsied. - Normal duodenal bulb and second portion of the duodenum. Path: mild chronic gastritis, H pylori negative  Wt Readings from Last 3 Encounters:  08/09/23 195 lb (88.5 kg)  07/20/22 174 lb (78.9 kg)  07/13/22 177 lb 7.5 oz  (80.5 kg)    Past Medical History:  Diagnosis Date   Anemia    Anxiety    Depression    Family history of adverse reaction to anesthesia    states her mother's blood pressure dropped low   GERD (gastroesophageal reflux disease)    Headache(784.0)    Hyperlipidemia    Kidney stone    Polycystic ovaries 05/15/2016   Thyroid  condition     Past Surgical History:  Procedure Laterality Date   CESAREAN SECTION  2013   NAILBED REPAIR Right 07/13/2022   Procedure: NAIL/NAILBED BIOPSY RIGHT MIDDLE FINGER;  Surgeon: Murrell Drivers, Jaclyn Macdonald;  Location: [facility];  Service: Orthopedics;  Laterality: Right;    Current Outpatient Medications  Medication Sig Dispense Refill   ferrous sulfate 325 (65 FE) MG tablet Take 325 mg by mouth daily.  0   Multiple Vitamin (MULTIVITAMIN) capsule Take 1 capsule by mouth daily.     polyethylene glycol powder (GLYCOLAX /MIRALAX ) 17 GM/SCOOP powder Take 17 g by mouth 2 (two) times daily as needed. (Patient not taking: Reported on 07/20/2022) 3350 g 1   traMADol  (ULTRAM ) 50 MG tablet 1-2 tabs PO q6 hours prn pain (Patient not taking: Reported on 07/20/2022) 20 tablet 0   No current facility-administered medications for this visit.    Allergies as of 08/09/2023   (No Known Allergies)    Family History  Problem Relation Age of Onset   Asthma Mother    Rashes / Skin problems Mother    Hypertension Mother  Colon polyps Mother    Hypertension Father    Heart disease Father    Breast cancer Maternal Aunt    Breast cancer Maternal Aunt    Breast cancer Maternal Aunt    Breast cancer Paternal Aunt    Breast cancer Paternal Aunt    Breast cancer Other    Colon cancer Neg Hx    Esophageal cancer Neg Hx    Stomach cancer Neg Hx    Rectal cancer Neg Hx     Review of Systems:    Constitutional: No weight loss, fever, chills, weakness or fatigue HEENT: Eyes: No change in vision               Ears, Nose, Throat:  No change in hearing or  congestion Skin: No rash or itching Cardiovascular: No chest pain, chest pressure or palpitations   Respiratory: No SOB or cough Gastrointestinal: See HPI and otherwise negative Genitourinary: No dysuria or change in urinary frequency Neurological: No headache, dizziness or syncope Musculoskeletal: No new muscle or joint pain Hematologic: No bleeding or bruising Psychiatric: No history of depression or anxiety    Physical Exam:  Vital signs: BP 122/68   Pulse 76   Ht 5' 4 (1.626 m)   Wt 195 lb (88.5 kg)   BMI 33.47 kg/m   Constitutional:   Pleasant  female appears to be in NAD, Well developed, Well nourished, alert and cooperative Throat: Oral cavity and pharynx without inflammation, swelling or lesion.  Respiratory: Respirations even and unlabored. Lungs clear to auscultation bilaterally.   No wheezes, crackles, or rhonchi.  Cardiovascular: Normal S1, S2. Regular rate and rhythm. No peripheral edema, cyanosis or pallor.  Gastrointestinal:  Soft, nondistended, nontender. No rebound or guarding. Normal bowel sounds. No appreciable masses or hepatomegaly. Rectal:  Not performed.  Msk:  Symmetrical without gross deformities. Without edema, no deformity or joint abnormality.  Neurologic:  Alert and  oriented x4;  grossly normal neurologically.  Skin:   Dry and intact without significant lesions or rashes. Psychiatric: Oriented to person, place and time. Demonstrates good judgement and reason without abnormal affect or behaviors.  RELEVANT LABS AND IMAGING: CBC    Latest Ref Rng & Units 08/19/2021   10:20 AM  CBC  WBC 3.4 - 10.8 x10E3/uL 5.6   Hemoglobin 11.1 - 15.9 g/dL 87.0   Hematocrit 65.9 - 46.6 % 40.0   Platelets 150 - 450 x10E3/uL 372      CMP     Latest Ref Rng & Units 08/19/2021   10:20 AM  CMP  Glucose 70 - 99 mg/dL 90   BUN 6 - 24 mg/dL 8   Creatinine 9.42 - 8.99 mg/dL 9.27   Sodium 865 - 855 mmol/L 139   Potassium 3.5 - 5.2 mmol/L 4.3   Chloride 96 - 106  mmol/L 103   CO2 20 - 29 mmol/L 23   Calcium 8.7 - 10.2 mg/dL 9.7   Total Protein 6.0 - 8.5 g/dL 7.4   Total Bilirubin 0.0 - 1.2 mg/dL 0.3   Alkaline Phos 44 - 121 IU/L 96   AST 0 - 40 IU/L 21   ALT 0 - 32 IU/L 21      Lab Results  Component Value Date   TSH 0.593 08/19/2021     Assessment: Encounter Diagnoses  Name Primary?   Internal hemorrhoids Yes   Rectal bleeding     43 year old female patient who presents to discuss hemorrhoid banding for internal hemorrhoids that  intermittently bleed, last episode in June and lasted over 4 days. Pt denies diarrhea or constipation. Reinforced she continue high fiber diet with high fluid intake and activity as tolerated. UTD on colon screening, next due 04/2027.  Plan: -Continue high fiber diet -Continue OTC Metamucil po daily  -Schedule hemorrhoid banding with Dr. Federico -recall colonoscopy 04/2027 for history of tubular adenoma  Thank you for the courtesy of this consult. Please call me with any questions or concerns.   Jaclyn Nelligan, Jaclyn Macdonald  Gastroenterology 08/09/2023, 11:27 AM  Cc: Jaclyn Barnie NOVAK, Jaclyn Macdonald

## 2023-08-10 NOTE — Progress Notes (Signed)
 I agree with the assessment and plan as outlined by Ms. May.

## 2023-08-17 ENCOUNTER — Encounter: Admitting: Internal Medicine

## 2023-08-20 ENCOUNTER — Encounter: Payer: Self-pay | Admitting: Advanced Practice Midwife

## 2023-10-08 ENCOUNTER — Ambulatory Visit (INDEPENDENT_AMBULATORY_CARE_PROVIDER_SITE_OTHER): Admitting: Nurse Practitioner

## 2023-10-08 ENCOUNTER — Encounter: Payer: Self-pay | Admitting: Nurse Practitioner

## 2023-10-08 VITALS — BP 128/80 | HR 89 | Ht 64.5 in | Wt 198.0 lb

## 2023-10-08 DIAGNOSIS — N939 Abnormal uterine and vaginal bleeding, unspecified: Secondary | ICD-10-CM

## 2023-10-08 DIAGNOSIS — B9689 Other specified bacterial agents as the cause of diseases classified elsewhere: Secondary | ICD-10-CM

## 2023-10-08 DIAGNOSIS — N760 Acute vaginitis: Secondary | ICD-10-CM

## 2023-10-08 DIAGNOSIS — N898 Other specified noninflammatory disorders of vagina: Secondary | ICD-10-CM

## 2023-10-08 LAB — WET PREP FOR TRICH, YEAST, CLUE

## 2023-10-08 MED ORDER — METRONIDAZOLE 0.75 % VA GEL
1.0000 | Freq: Every day | VAGINAL | 0 refills | Status: AC
Start: 2023-10-08 — End: 2023-10-13

## 2023-10-08 NOTE — Progress Notes (Signed)
   Acute Office Visit  Subjective:    Patient ID: Jaclyn Macdonald, female    DOB: Jul 25, 1980, 43 y.o.   MRN: [id number]   HPI 43 y.o. presents today for bleeding. Bleeding started 09/18/23 and was light for 4 days, then had a day or 2 of heavy bleeding with clots, then went back to light and has been bleeding since. Cycles are mostly regular. Not sexually active. Diagnosed with PCOS in the past but then later told she did not have it. Has skipped menses here and there in the past. Normal testosterone in 2023. Hirsutism. Normal ovaries 04/2022, small fibroid noted. Denies itching, discharge or odor.   Patient's last menstrual period was 09/18/2023 (approximate). Period Duration (Days): Bleeding since 09/18/23 Period Pattern: (!) Irregular Menstrual Flow: Moderate, Heavy Menstrual Control: Maxi pad Menstrual Control Change Freq (Hours): 2-3 Dysmenorrhea: (!) Moderate Dysmenorrhea Symptoms: Cramping, Headache  Review of Systems  Constitutional: Negative.   Genitourinary:  Positive for vaginal bleeding. Negative for vaginal discharge.       Objective:    Physical Exam Constitutional:      Appearance: Normal appearance. She is obese.  Genitourinary:    General: Normal vulva.     Vagina: Normal.     Cervix: Normal.     Comments: + odor    BP 128/80 (BP Location: Left Arm, Patient Position: Sitting, Cuff Size: Normal)   Pulse 89   Ht 5' 4.5 (1.638 m)   Wt 198 lb (89.8 kg)   LMP 09/18/2023 (Approximate)   SpO2 98%   BMI 33.46 kg/m  Wt Readings from Last 3 Encounters:  10/08/23 198 lb (89.8 kg)  08/09/23 195 lb (88.5 kg)  07/20/22 174 lb (78.9 kg)        Dereck Keas, CMA present as Biomedical engineer.   Wet prep + clue cells (+ odor)  Assessment & Plan:   Problem List Items Addressed This Visit   None Visit Diagnoses       BV (bacterial vaginosis)    -  Primary   Relevant Medications   metroNIDAZOLE  (METROGEL ) 0.75 % vaginal gel     Vaginal bleeding          Plan:  Wet prep positive for BV - metrogel  0.75% nightly x 5 nights. If bleeding continues after treatment ultrasound recommended. Briefly discussed criteria for PCOS.   Return if symptoms worsen or fail to improve.    Jaclyn DELENA Shutter DNP, 11:15 AM 10/08/2023

## 2023-10-08 NOTE — Addendum Note (Signed)
 Addended byBETHA PRENTISS RIGGS on: 10/08/2023 12:23 PM   Modules accepted: Orders

## 2023-10-20 ENCOUNTER — Encounter: Payer: Self-pay | Admitting: Nurse Practitioner

## 2023-10-20 ENCOUNTER — Ambulatory Visit (INDEPENDENT_AMBULATORY_CARE_PROVIDER_SITE_OTHER): Admitting: Nurse Practitioner

## 2023-10-20 VITALS — BP 118/80 | HR 77

## 2023-10-20 DIAGNOSIS — D509 Iron deficiency anemia, unspecified: Secondary | ICD-10-CM

## 2023-10-20 DIAGNOSIS — R5383 Other fatigue: Secondary | ICD-10-CM

## 2023-10-20 DIAGNOSIS — N898 Other specified noninflammatory disorders of vagina: Secondary | ICD-10-CM | POA: Diagnosis not present

## 2023-10-20 DIAGNOSIS — N939 Abnormal uterine and vaginal bleeding, unspecified: Secondary | ICD-10-CM | POA: Diagnosis not present

## 2023-10-20 MED ORDER — MEGESTROL ACETATE 40 MG PO TABS
40.0000 mg | ORAL_TABLET | Freq: Two times a day (BID) | ORAL | 1 refills | Status: DC
Start: 2023-10-20 — End: 2023-12-02

## 2023-10-20 NOTE — Progress Notes (Signed)
   Acute Office Visit  Subjective:    Patient ID: Jaclyn Macdonald, female    DOB: 24-Jun-1980, 43 y.o.   MRN: [id number]   HPI 43 y.o. presents today for vaginal bleeding. Bleeding initially began 09/18/23, which was her normal menstrual timing and was light for 4 days, then had a day or 2 of heavy bleeding with clots, then went back to light and has been bleeding since. Bleeding has been light until 3 days ago when she had large clots for a couple of days. That night she was up 6 times due to bleeding. Could be her normal menstrual timing. Cycles are mostly regular. Treated for BV 10/08/23. Used Metrogel  but worried bleeding interfered with treatment. US  04/2022 noted small subserosal fibroid, otherwise normal. Not sexually active. Has felt more tired lately. H/O anemia - taking daily iron supplement.   Patient's last menstrual period was 09/18/2023 (approximate). Period Pattern: (!) Irregular Menstrual Flow: Heavy Menstrual Control: Maxi pad Dysmenorrhea: (!) Severe Dysmenorrhea Symptoms: Cramping, Nausea, Headache  Review of Systems  Constitutional:  Positive for fatigue.  Genitourinary:  Positive for vaginal bleeding.       Objective:    Physical Exam Constitutional:      Appearance: Normal appearance.  Genitourinary:    General: Normal vulva.     Vagina: Bleeding present.     BP 118/80   Pulse 77   LMP 09/18/2023 (Approximate)   SpO2 98%  Wt Readings from Last 3 Encounters:  10/08/23 198 lb (89.8 kg)  08/09/23 195 lb (88.5 kg)  07/20/22 174 lb (78.9 kg)        Zada Louder, CMA present as Biomedical engineer.   Wet prep negative for pathogens  Assessment & Plan:   Problem List Items Addressed This Visit   None Visit Diagnoses       Abnormal uterine bleeding    -  Primary   Relevant Medications   megestrol  (MEGACE ) 40 MG tablet   Other Relevant Orders   US  PELVIS TRANSVAGINAL NON-OB (TV ONLY)     Fatigue, unspecified type       Relevant Orders   CBC with  Differential/Platelet   Iron, TIBC and Ferritin Panel     Iron deficiency anemia, unspecified iron deficiency anemia type       Relevant Orders   CBC with Differential/Platelet   Iron, TIBC and Ferritin Panel     Vaginal discharge       Relevant Orders   WET PREP FOR TRICH, YEAST, CLUE      Plan: Schedule ultrasound. Megace  BID x 10 days. CBC, iron panel today. Continue iron supplement. Wet prep negative.      [name] Shutter DNP, 12:26 PM 10/20/2023

## 2023-10-21 ENCOUNTER — Ambulatory Visit: Payer: Self-pay | Admitting: Nurse Practitioner

## 2023-10-21 LAB — CBC WITH DIFFERENTIAL/PLATELET
Absolute Lymphocytes: 2794 {cells}/uL (ref 850–3900)
Absolute Monocytes: 487 {cells}/uL (ref 200–950)
Basophils Absolute: 50 {cells}/uL (ref 0–200)
Basophils Relative: 0.9 %
Eosinophils Absolute: 168 {cells}/uL (ref 15–500)
Eosinophils Relative: 3 %
HCT: 36.8 % (ref 35.0–45.0)
Hemoglobin: 11.5 g/dL — ABNORMAL LOW (ref 11.7–15.5)
MCH: 25.4 pg — ABNORMAL LOW (ref 27.0–33.0)
MCHC: 31.3 g/dL — ABNORMAL LOW (ref 32.0–36.0)
MCV: 81.2 fL (ref 80.0–100.0)
MPV: 10.2 fL (ref 7.5–12.5)
Monocytes Relative: 8.7 %
Neutro Abs: 2100 {cells}/uL (ref 1500–7800)
Neutrophils Relative %: 37.5 %
Platelets: 363 Thousand/uL (ref 140–400)
RBC: 4.53 Million/uL (ref 3.80–5.10)
RDW: 15.7 % — ABNORMAL HIGH (ref 11.0–15.0)
Total Lymphocyte: 49.9 %
WBC: 5.6 Thousand/uL (ref 3.8–10.8)

## 2023-10-21 LAB — WET PREP FOR TRICH, YEAST, CLUE

## 2023-10-21 LAB — IRON,TIBC AND FERRITIN PANEL
%SAT: 19 % (ref 16–45)
Ferritin: 27 ng/mL (ref 16–232)
Iron: 74 ug/dL (ref 40–190)
TIBC: 383 ug/dL (ref 250–450)

## 2023-11-02 ENCOUNTER — Ambulatory Visit (INDEPENDENT_AMBULATORY_CARE_PROVIDER_SITE_OTHER)

## 2023-11-02 ENCOUNTER — Other Ambulatory Visit: Admitting: Nurse Practitioner

## 2023-11-02 ENCOUNTER — Other Ambulatory Visit

## 2023-11-02 DIAGNOSIS — N939 Abnormal uterine and vaginal bleeding, unspecified: Secondary | ICD-10-CM | POA: Diagnosis not present

## 2023-11-02 NOTE — Progress Notes (Deleted)
   Acute Office Visit  Subjective:    Patient ID: Jaclyn Macdonald, female    DOB: [birth year], 43 y.o.   MRN: [id number]   HPI 43 y.o. presents today for ultrasound. Seen 10/20/23 for vaginal bleeding. Bleeding initially began 09/18/23, which was her normal menstrual timing and was light for 4 days, then had a day or 2 of heavy bleeding with clots, then went back to light and has been bleeding since. Bleeding has been light until 3 days ago when she had large clots for a couple of days. That night she was up 6 times due to bleeding. Could be her normal menstrual timing. Cycles are mostly regular. US  04/2022 noted small subserosal fibroid, otherwise normal. Not sexually active. Taking iron suppe  Patient's last menstrual period was 09/18/2023 (approximate).    Review of Systems     Objective:    Physical Exam  LMP 09/18/2023 (Approximate)  Wt Readings from Last 3 Encounters:  10/08/23 198 lb (89.8 kg)  08/09/23 195 lb (88.5 kg)  07/20/22 174 lb (78.9 kg)        Zada Louder, CMA present as Biomedical engineer.   Assessment & Plan:   Problem List Items Addressed This Visit   None   No follow-ups on file.    [name] Shutter DNP, 8:04 AM 11/02/2023

## 2023-11-03 ENCOUNTER — Ambulatory Visit (INDEPENDENT_AMBULATORY_CARE_PROVIDER_SITE_OTHER): Admitting: Nurse Practitioner

## 2023-11-03 ENCOUNTER — Encounter: Payer: Self-pay | Admitting: Nurse Practitioner

## 2023-11-03 VITALS — BP 114/78 | HR 68 | Resp 16

## 2023-11-03 DIAGNOSIS — N9489 Other specified conditions associated with female genital organs and menstrual cycle: Secondary | ICD-10-CM

## 2023-11-03 DIAGNOSIS — D259 Leiomyoma of uterus, unspecified: Secondary | ICD-10-CM | POA: Diagnosis not present

## 2023-11-03 DIAGNOSIS — N939 Abnormal uterine and vaginal bleeding, unspecified: Secondary | ICD-10-CM | POA: Diagnosis not present

## 2023-11-03 NOTE — Progress Notes (Signed)
   Acute Office Visit  Subjective:    Patient ID: Jaclyn Macdonald, female    DOB: Jun 02, 1980, 43 y.o.   MRN: [id number]   HPI 43 y.o. presents today for ultrasound consult. Seen 10/20/23 for vaginal bleeding. Bleeding initially began 09/18/23, which was her normal menstrual timing and was light for 4 days, then had a day or 2 of heavy bleeding with clots, then went back to light and has been bleeding since. Bleeding was light until 9/14 when she had large clots for a couple of days. That night she was up 6 times due to bleeding. Could have been her normal menstrual timing. Cycles are mostly regular. Has always had heavy periods due to PCOS. US  04/2022 noted small subserosal fibroid, otherwise normal. Not sexually active. Has felt more tired lately. H/O anemia - taking daily iron supplement. Normal hgb and iron panel 9/17. No bleeding since taking Megace . Mother had hysterectomy in her 30s, thinks due to fibroids.  Patient's last menstrual period was 09/18/2023 (approximate).    Review of Systems  Constitutional: Negative.   Genitourinary:  Positive for vaginal bleeding (Resolved).       Objective:    Physical Exam Constitutional:      Appearance: Normal appearance.   GU: Not indicated  BP 114/78   Pulse 68   Resp 16   LMP 09/18/2023 (Approximate)  Wt Readings from Last 3 Encounters:  10/08/23 198 lb (89.8 kg)  08/09/23 195 lb (88.5 kg)  07/20/22 174 lb (78.9 kg)        Assessment & Plan:   Problem List Items Addressed This Visit   None Visit Diagnoses       Abnormal uterine bleeding    -  Primary     Endometrial mass         Uterine leiomyoma, unspecified location          Vaginal ultrasound (comparison is made with prior scan March 2024): Retroverted uterus, normal size.  Inhomogeneous myometrium with streaky shadowing suggestive of adenomyosis.  Previous fibroid increased in size to 2.2 cm, plus additional fibroid seen today measuring 2.1 cm.  Asymmetrically  thickened endometrium with a 1.6 x 1.0 cm echogenic intracavitary mass toward left cornua.  Feeder vessel identified and confirmed on 3D scan.  This mass was not present on prior scan.  Both ovaries small with scant follicles.  No adnexal masses, no free fluid.  Plan: Ultrasound findings reviewed. Two small fibroids, reassured likely not cause for bleeding. Polyp present. Discussed small risk (less than 5%) of developing into precancer. Recommend hysteroscopy.       Jaclyn DELENA Shutter DNP, 9:42 AM 11/03/2023

## 2023-11-05 ENCOUNTER — Encounter: Payer: Self-pay | Admitting: Obstetrics and Gynecology

## 2023-11-05 ENCOUNTER — Ambulatory Visit (INDEPENDENT_AMBULATORY_CARE_PROVIDER_SITE_OTHER): Admitting: Obstetrics and Gynecology

## 2023-11-05 VITALS — BP 114/78 | HR 82 | Temp 98.2°F | Ht 64.0 in | Wt 194.0 lb

## 2023-11-05 DIAGNOSIS — N939 Abnormal uterine and vaginal bleeding, unspecified: Secondary | ICD-10-CM

## 2023-11-05 DIAGNOSIS — D259 Leiomyoma of uterus, unspecified: Secondary | ICD-10-CM | POA: Diagnosis not present

## 2023-11-05 DIAGNOSIS — N9489 Other specified conditions associated with female genital organs and menstrual cycle: Secondary | ICD-10-CM

## 2023-11-05 NOTE — Assessment & Plan Note (Addendum)
 Plan for hysteroscopy, dilation and curettage, polypectomy. Encouraged continued megace  for AUB until procedure is complete  Discussed outpatient procedure. Reviewed that  recovery is usually 1-2 days. Risks including infections, bleeding, and damage to surrounding organs reviewed. Recommend NPO prior to midnight and reviewed medication to take on day of surgery. Dicussed use of NSAIDS as needed for pain postoperatively.  Preop checklist: Antibiotics: none DVT ppx: SCDs Postop visit: 1 week Additional clearance: none

## 2023-11-05 NOTE — Progress Notes (Signed)
 43 y.o. [id number] female with ?PCOS, AUB-F, endometrial mass here for surg consult: dx hyst, D&C, polypectomy. Single. Referred by T. Prentiss, NP.  Patient's last menstrual period was 09/18/2023 (approximate). Period Cycle (Days): 30 Period Duration (Days): Bleeding for over one month Period Pattern: (!) Irregular Menstrual Flow: Moderate Menstrual Control: Maxi pad Menstrual Control Change Freq (Hours): 2-3 Dysmenorrhea: (!) Mild Dysmenorrhea Symptoms: Cramping, Headache, Nausea  She reports abnormal uterine bleeding from August 16 - September 18. AUB stopped w/ megace , so she stopped it this week. Longstanding bleeding issues that recently worsened, she is tearful about this today because she has had difficulty understanding why her periods are so irregular.  Also have rectal bleeding from hemorrhoids, had to cancel hemorrhoid procedure due to ongoing bleeding.  PCOS: Treated with metformin in past but caused hair loss so she stopped it 2024 recommend POP, however patient declined treatment  10/09/22 TSH wnl 10/20/23 Hgb 11.5 12/04/21 testosterone 34 (normal)  11/02/2023 TVUS: Retroverted uterus, normal size.  Inhomogeneous myometrium with streaky shadowing suggestive of adenomyosis.  Previous fibroid increased in size to 2.2 cm, plus additional fibroid seen today measuring 2.1 cm.  Asymmetrically thickened endometrium with a 1.6 x 1.0 cm echogenic intracavitary mass toward left cornua.  Feeder vessel identified and confirmed on 3D scan.  This mass was not present on prior scan.     Both ovaries small with scant follicles.     No adnexal masses, no free fluid.  GYN HISTORY: No sig hx  OB History  Gravida Para Term Preterm AB Living  5 3 3  2 3   SAB IAB Ectopic Multiple Live Births  1 1       # Outcome Date GA Lbr Len/2nd Weight Sex Type Anes PTL Lv  5 Term           4 Term           3 Term           2 SAB           1 IAB            Past Medical History:  Diagnosis  Date   Anemia    Anxiety    Depression    Family history of adverse reaction to anesthesia    states her mother's blood pressure dropped low   GERD (gastroesophageal reflux disease)    Headache(784.0)    Hyperlipidemia    Kidney stone    Polycystic ovaries 05/15/2016   Thyroid  condition    Past Surgical History:  Procedure Laterality Date   CESAREAN SECTION  2013   NAILBED REPAIR Right 07/13/2022   Procedure: NAIL/NAILBED BIOPSY RIGHT MIDDLE FINGER;  Surgeon: Murrell Drivers, MD;  Location: [facility];  Service: Orthopedics;  Laterality: Right;   Current Outpatient Medications on File Prior to Visit  Medication Sig Dispense Refill   ferrous sulfate 325 (65 FE) MG tablet Take 325 mg by mouth daily.  0   megestrol  (MEGACE ) 40 MG tablet Take 1 tablet (40 mg total) by mouth 2 (two) times daily. 20 tablet 1   Multiple Vitamin (MULTIVITAMIN) capsule Take 1 capsule by mouth daily.     polyethylene glycol powder (GLYCOLAX /MIRALAX ) 17 GM/SCOOP powder Take 17 g by mouth 2 (two) times daily as needed. 3350 g 1   Psyllium (METAMUCIL PO) Take by mouth.     No current facility-administered medications on file prior to visit.   No Known Allergies    PE Today's  Vitals   11/05/23 1005  BP: 114/78  Pulse: 82  Temp: 98.2 F (36.8 C)  TempSrc: Oral  SpO2: 99%  Weight: 194 lb (88 kg)  Height: 5' 4 (1.626 m)   Body mass index is 33.3 kg/m.  Physical Exam Vitals reviewed.  Constitutional:      General: She is not in acute distress.    Appearance: Normal appearance.  HENT:     Head: Normocephalic and atraumatic.     Nose: Nose normal.  Eyes:     Extraocular Movements: Extraocular movements intact.     Conjunctiva/sclera: Conjunctivae normal.  Cardiovascular:     Rate and Rhythm: Normal rate and regular rhythm.     Heart sounds: No murmur heard.    No friction rub. No gallop.  Pulmonary:     Effort: Pulmonary effort is normal. No respiratory distress.     Breath  sounds: Normal breath sounds. No stridor. No wheezing, rhonchi or rales.  Musculoskeletal:        General: Normal range of motion.     Cervical back: Normal range of motion.  Neurological:     General: No focal deficit present.     Mental Status: She is alert.  Psychiatric:        Mood and Affect: Mood normal.        Behavior: Behavior normal.      Assessment and Plan:        Abnormal uterine bleeding Assessment & Plan: Plan for hysteroscopy, dilation and curettage, polypectomy. Encouraged continued megace  for AUB until procedure is complete  Discussed outpatient procedure. Reviewed that  recovery is usually 1-2 days. Risks including infections, bleeding, and damage to surrounding organs reviewed. Recommend NPO prior to midnight and reviewed medication to take on day of surgery. Dicussed use of NSAIDS as needed for pain postoperatively.  Preop checklist: Antibiotics: none DVT ppx: SCDs Postop visit: 1 week Additional clearance: none    Orders: -     Ambulatory Referral For Surgery Scheduling  Endometrial mass -     Ambulatory Referral For Surgery Scheduling  Uterine leiomyoma, unspecified location Assessment & Plan: Discussed small fibroids and treatment including continued megace  and hysteroscopy vs hysterectomy in setting of AUB Patient would like to proceed with hysteroscopy at this time     Vera LULLA Pa, MD

## 2023-11-05 NOTE — Assessment & Plan Note (Signed)
 Discussed small fibroids and treatment including continued megace  and hysteroscopy vs hysterectomy in setting of AUB Patient would like to proceed with hysteroscopy at this time

## 2023-12-01 ENCOUNTER — Telehealth: Payer: Self-pay | Admitting: *Deleted

## 2023-12-01 ENCOUNTER — Encounter: Payer: Self-pay | Admitting: *Deleted

## 2023-12-01 NOTE — Telephone Encounter (Signed)
 Spoke with patient, surgery scheduled for 01/13/24. See surgery referral, patient declined earlier surgery dates.   Patient states menses has started again on 11/28/23. Saturates clothing with small clots. States she is taking her iron daily. Reports fatigue. Denies lightheadedness, weakness, dizziness, SOB.   Patient was taking megace  previously, has been off of medication, states she has 2 pills remaining. Will need new RX.   Patient states she prefers not to take pain medication or medication long term.   Advised since surgery is scheduled for later, she may need to restart megace  to control bleeding until surgery. Advised Dr. Dallie is out of the office this week, will review with Tiffany and f/u with recommendations. ER precautions provided for pain and bleeding. Patient agreeable.   Tiffany -please review.

## 2023-12-02 ENCOUNTER — Other Ambulatory Visit: Payer: Self-pay | Admitting: Nurse Practitioner

## 2023-12-02 DIAGNOSIS — E611 Iron deficiency: Secondary | ICD-10-CM

## 2023-12-02 DIAGNOSIS — N939 Abnormal uterine and vaginal bleeding, unspecified: Secondary | ICD-10-CM

## 2023-12-02 MED ORDER — MEGESTROL ACETATE 40 MG PO TABS: 40.0000 mg | ORAL_TABLET | Freq: Two times a day (BID) | ORAL | 1 refills | Status: AC

## 2023-12-02 NOTE — Telephone Encounter (Signed)
 Spoke with patient. Advised per Tiffany. Patient reports bleeding has slowed to changing a pad q1-2 hours. Cramping is much better.   Patient verbalizes understanding and is agreeable.   Encounter closed.

## 2023-12-02 NOTE — Telephone Encounter (Signed)
 Can restart Megace . Refill sent to pharmacy. Continue iron supplement daily. Ibuprofen 800 mg every 8 hours for 3-5 days.

## 2023-12-15 ENCOUNTER — Ambulatory Visit: Admitting: Radiology

## 2023-12-15 ENCOUNTER — Encounter: Payer: Self-pay | Admitting: Radiology

## 2023-12-15 VITALS — BP 98/70 | HR 93 | Ht 64.5 in | Wt 190.0 lb

## 2023-12-15 DIAGNOSIS — Z9189 Other specified personal risk factors, not elsewhere classified: Secondary | ICD-10-CM | POA: Diagnosis not present

## 2023-12-15 DIAGNOSIS — Z1331 Encounter for screening for depression: Secondary | ICD-10-CM | POA: Diagnosis not present

## 2023-12-15 DIAGNOSIS — N9489 Other specified conditions associated with female genital organs and menstrual cycle: Secondary | ICD-10-CM

## 2023-12-15 DIAGNOSIS — Z01419 Encounter for gynecological examination (general) (routine) without abnormal findings: Secondary | ICD-10-CM

## 2023-12-15 NOTE — Progress Notes (Signed)
 Jaclyn Macdonald [birth year] [id number]   History:  43 y.o. G5P3 presents for annual exam. Schedule for hyst D&C next month with Dr Dallie. Endometrial mass and persistent irregular bleeding. Uses Megace  prn.  Gynecologic History Patient's last menstrual period was 11/25/2023 (exact date). Period Cycle (Days):  (long period 11/25/23) Period Pattern: (!) Irregular Menstrual Flow: Light, Moderate, Heavy Menstrual Control: Thin pad, Maxi pad Dysmenorrhea: (!) Severe Dysmenorrhea Symptoms: Cramping Contraception/Family planning: abstinence Sexually active: yes Last Pap: 2024. Results were: normal Last mammogram: 4/25. Results were: normal  Obstetric History OB History  Gravida Para Term Preterm AB Living  5 3 3  2 3   SAB IAB Ectopic Multiple Live Births  1 1       # Outcome Date GA Lbr Len/2nd Weight Sex Type Anes PTL Lv  5 Term           4 Term           3 Term           2 SAB           1 IAB                12/15/2023    1:30 PM 03/19/2022    1:05 PM 02/10/2022   11:51 AM  Depression screen PHQ 2/9  Decreased Interest 3 3 2   Down, Depressed, Hopeless 2 3 2   PHQ - 2 Score 5 6 4   Altered sleeping 1 3 3   Tired, decreased energy 3 0 2  Change in appetite 1 0 2  Feeling bad or failure about yourself  2 3 3   Trouble concentrating 1 3 3   Moving slowly or fidgety/restless 0 0 0  Suicidal thoughts 0 0 0  PHQ-9 Score 13 15  17    Difficult doing work/chores Very difficult Very difficult      Data saved with a previous flowsheet row definition     The following portions of the patient's history were reviewed and updated as appropriate: allergies, current medications, past family history, past medical history, past social history, past surgical history, and problem list.  ROS  Past medical history, past surgical history, family history and social history were all reviewed and documented in the EPIC chart.  Exam:  Vitals:   12/15/23 1328  BP: 98/70  Pulse: 93  SpO2: 98%   Weight: 190 lb (86.2 kg)  Height: 5' 4.5 (1.638 m)   Body mass index is 32.11 kg/m.  Physical Exam Vitals and nursing note reviewed. Exam conducted with a chaperone present.  Constitutional:      Appearance: Normal appearance. She is normal weight.  HENT:     Head: Normocephalic and atraumatic.  Neck:     Thyroid : No thyroid  mass, thyromegaly or thyroid  tenderness.  Cardiovascular:     Rate and Rhythm: Regular rhythm.     Heart sounds: Normal heart sounds.  Pulmonary:     Effort: Pulmonary effort is normal.     Breath sounds: Normal breath sounds.  Chest:  Breasts:    Breasts are symmetrical.     Right: Normal. No inverted nipple, mass, nipple discharge, skin change or tenderness.     Left: Normal. No inverted nipple, mass, nipple discharge, skin change or tenderness.  Abdominal:     General: Abdomen is flat. Bowel sounds are normal.     Palpations: Abdomen is soft.  Genitourinary:    General: Normal vulva.     Vagina: Normal. No vaginal discharge, bleeding or lesions.  Cervix: Normal. No discharge or lesion.     Uterus: Normal. Not enlarged and not tender.      Adnexa: Right adnexa normal and left adnexa normal.       Right: No mass, tenderness or fullness.         Left: No mass, tenderness or fullness.    Lymphadenopathy:     Upper Body:     Right upper body: No axillary adenopathy.     Left upper body: No axillary adenopathy.  Skin:    General: Skin is warm and dry.  Neurological:     Mental Status: She is alert and oriented to person, place, and time.  Psychiatric:        Mood and Affect: Mood normal.        Thought Content: Thought content normal.        Judgment: Judgment normal.      [name], CMA present for exam  Assessment/Plan:   1. Well woman exam with routine gynecological exam (Primary) Pap 2027  2. Endometrial mass Hyst D&C scheduled next month Continue to use Megace  prn  3. Depression screen Positive,managed by PCP    Return in  about 1 year (around 12/14/2024) for Annual.  [name] B WHNP-BC 2:00 PM 12/15/2023

## 2023-12-20 NOTE — Progress Notes (Incomplete)
    43 y.o. [id number] female with *** here for surgical consultation for ***. Single.  Patient's last menstrual period was 11/25/2023 (exact date).    Last PAP:    Component Value Date/Time   DIAGPAP  07/20/2022 1022    - Negative for intraepithelial lesion or malignancy (NILM)   HPVHIGH Negative 07/20/2022 1022   ADEQPAP  07/20/2022 1022    Satisfactory for evaluation; transformation zone component PRESENT.   Birth control: *** Sexually active: ***    GYN HISTORY: ***  OB History  Gravida Para Term Preterm AB Living  5 3 3  2 3   SAB IAB Ectopic Multiple Live Births  1 1       # Outcome Date GA Lbr Len/2nd Weight Sex Type Anes PTL Lv  5 Term           4 Term           3 Term           2 SAB           1 IAB             Past Medical History:  Diagnosis Date   Anemia    Anxiety    Depression    Family history of adverse reaction to anesthesia    states her mother's blood pressure dropped low   GERD (gastroesophageal reflux disease)    Headache(784.0)    Hyperlipidemia    Kidney stone    Polycystic ovaries 05/15/2016   Thyroid  condition     Past Surgical History:  Procedure Laterality Date   CESAREAN SECTION  2013   NAILBED REPAIR Right 07/13/2022   Procedure: NAIL/NAILBED BIOPSY RIGHT MIDDLE FINGER;  Surgeon: Murrell Drivers, MD;  Location: [facility];  Service: Orthopedics;  Laterality: Right;    Current Outpatient Medications on File Prior to Visit  Medication Sig Dispense Refill   ferrous sulfate 325 (65 FE) MG tablet Take 325 mg by mouth daily.  0   megestrol  (MEGACE ) 40 MG tablet Take 1 tablet (40 mg total) by mouth 2 (two) times daily. (Patient not taking: Reported on 12/15/2023) 20 tablet 1   Multiple Vitamin (MULTIVITAMIN) capsule Take 1 capsule by mouth daily.     polyethylene glycol powder (GLYCOLAX /MIRALAX ) 17 GM/SCOOP powder Take 17 g by mouth 2 (two) times daily as needed. 3350 g 1   Psyllium (METAMUCIL PO) Take by mouth.      VITAMIN D  PO Take by mouth.     No current facility-administered medications on file prior to visit.    No Known Allergies    PE There were no vitals filed for this visit. There is no height or weight on file to calculate BMI.  Physical Exam    Assessment and Plan:        There are no diagnoses linked to this encounter.   Clotilda FORBES Pa, CMA

## 2023-12-22 ENCOUNTER — Encounter: Payer: Self-pay | Admitting: Obstetrics and Gynecology

## 2023-12-22 ENCOUNTER — Ambulatory Visit: Admitting: Obstetrics and Gynecology

## 2023-12-22 VITALS — BP 104/62 | HR 78 | Temp 98.0°F | Ht 65.5 in | Wt 191.0 lb

## 2023-12-22 DIAGNOSIS — N9489 Other specified conditions associated with female genital organs and menstrual cycle: Secondary | ICD-10-CM | POA: Diagnosis not present

## 2023-12-22 DIAGNOSIS — N939 Abnormal uterine and vaginal bleeding, unspecified: Secondary | ICD-10-CM

## 2023-12-22 NOTE — Assessment & Plan Note (Signed)
 Plan for hysteroscopy, dilation and curettage, polypectomy. Encouraged continued megace  for AUB until procedure is complete  Discussed outpatient procedure. Reviewed that  recovery is usually 1-2 days. Risks including infections, bleeding, and damage to surrounding organs reviewed. Recommend NPO prior to midnight and reviewed medication to take on day of surgery. Dicussed use of NSAIDS as needed for pain postoperatively.  Preop checklist: Antibiotics: none DVT ppx: SCDs Postop visit: 1 week Additional clearance: none

## 2023-12-22 NOTE — Progress Notes (Signed)
 43 y.o. [id number] female with ?PCOS, AUB-F, endometrial mass here for preop exam: dx hyst, D&C, polypectomy scheduled 01/13/24. Single. Referred by T. Prentiss, NP.  Patient's last menstrual period was 11/25/2023 (exact date).   She reports abnormal uterine bleeding from August 16 - September 18. AUB stopped w/ megace , so she stopped it this week. Longstanding bleeding issues that recently worsened, she is tearful about this today because she has had difficulty understanding why her periods are so irregular.  Also have rectal bleeding from hemorrhoids, had to cancel hemorrhoid procedure due to ongoing bleeding.  PCOS: Treated with metformin in past but caused hair loss so she stopped it 2024 recommend POP, however patient declined treatment  10/09/22 TSH wnl 10/20/23 Hgb 11.5 12/04/21 testosterone 34 (normal)  Today, she reports she stopped megace  in September, had 10d of bleeding in October with severe cramping. Did not take ibuprofen or megace .  No longer bleeding or cramping No CP or SOB with activity Planning to start anti-inflammatory diet for PCOS  11/02/2023 TVUS: Retroverted uterus, normal size.  Inhomogeneous myometrium with streaky shadowing suggestive of adenomyosis.  Previous fibroid increased in size to 2.2 cm, plus additional fibroid seen today measuring 2.1 cm.  Asymmetrically thickened endometrium with a 1.6 x 1.0 cm echogenic intracavitary mass toward left cornua.  Feeder vessel identified and confirmed on 3D scan.  This mass was not present on prior scan.     Both ovaries small with scant follicles.     No adnexal masses, no free fluid.  GYN HISTORY: No sig hx  OB History  Gravida Para Term Preterm AB Living  5 3 3  2 3   SAB IAB Ectopic Multiple Live Births  1 1   3     # Outcome Date GA Lbr Len/2nd Weight Sex Type Anes PTL Lv  5 Term      CS-Unspec   LIV  4 Term      Vag-Spont   LIV  3 Term      Vag-Spont   LIV  2 SAB           1 IAB            Past  Medical History:  Diagnosis Date   Anemia    Anxiety    Depression    Family history of adverse reaction to anesthesia    states her mother's blood pressure dropped low   GERD (gastroesophageal reflux disease)    Headache(784.0)    Hyperlipidemia    Kidney stone    Polycystic ovaries 05/15/2016   Thyroid  condition    Past Surgical History:  Procedure Laterality Date   CESAREAN SECTION  2013   NAILBED REPAIR Right 07/13/2022   Procedure: NAIL/NAILBED BIOPSY RIGHT MIDDLE FINGER;  Surgeon: Murrell Drivers, MD;  Location: [facility];  Service: Orthopedics;  Laterality: Right;   Current Outpatient Medications on File Prior to Visit  Medication Sig Dispense Refill   ferrous sulfate 325 (65 FE) MG tablet Take 325 mg by mouth daily.  0   Multiple Vitamin (MULTIVITAMIN) capsule Take 1 capsule by mouth daily.     polyethylene glycol powder (GLYCOLAX /MIRALAX ) 17 GM/SCOOP powder Take 17 g by mouth 2 (two) times daily as needed. 3350 g 1   Psyllium (METAMUCIL PO) Take by mouth.     VITAMIN D  PO Take by mouth.     megestrol  (MEGACE ) 40 MG tablet Take 1 tablet (40 mg total) by mouth 2 (two) times daily. (Patient not taking: Reported  on 12/22/2023) 20 tablet 1   No current facility-administered medications on file prior to visit.   Allergies  Allergen Reactions   Levonorgestrel-Ethinyl Estrad     Other Reaction(s): Unknown      PE Today's Vitals   12/22/23 1153  BP: 104/62  Pulse: 78  Temp: 98 F (36.7 C)  TempSrc: Oral  SpO2: 98%  Weight: 191 lb (86.6 kg)  Height: 5' 5.5 (1.664 m)    Body mass index is 31.3 kg/m.  Physical Exam Vitals reviewed.  Constitutional:      General: She is not in acute distress.    Appearance: Normal appearance.  HENT:     Head: Normocephalic and atraumatic.     Nose: Nose normal.  Eyes:     Extraocular Movements: Extraocular movements intact.     Conjunctiva/sclera: Conjunctivae normal.  Cardiovascular:     Rate and Rhythm:  Normal rate and regular rhythm.     Heart sounds: No murmur heard.    No friction rub. No gallop.  Pulmonary:     Effort: Pulmonary effort is normal. No respiratory distress.     Breath sounds: Normal breath sounds. No stridor. No wheezing, rhonchi or rales.  Musculoskeletal:        General: Normal range of motion.     Cervical back: Normal range of motion.  Neurological:     General: No focal deficit present.     Mental Status: She is alert.  Psychiatric:        Mood and Affect: Mood normal.        Behavior: Behavior normal.      Assessment and Plan:        Endometrial mass  Abnormal uterine bleeding Assessment & Plan: Plan for hysteroscopy, dilation and curettage, polypectomy. Encouraged continued megace  for AUB until procedure is complete  Discussed outpatient procedure. Reviewed that  recovery is usually 1-2 days. Risks including infections, bleeding, and damage to surrounding organs reviewed. Recommend NPO prior to midnight and reviewed medication to take on day of surgery. Dicussed use of NSAIDS as needed for pain postoperatively.  Preop checklist: Antibiotics: none DVT ppx: SCDs Postop visit: 1 week Additional clearance: none       Vera LULLA Pa, MD

## 2023-12-22 NOTE — H&P (View-Only) (Signed)
 43 y.o. [id number] female with ?PCOS, AUB-F, endometrial mass here for preop exam: dx hyst, D&C, polypectomy scheduled 01/13/24. Single. Referred by T. Prentiss, NP.  Patient's last menstrual period was 11/25/2023 (exact date).   She reports abnormal uterine bleeding from August 16 - September 18. AUB stopped w/ megace , so she stopped it this week. Longstanding bleeding issues that recently worsened, she is tearful about this today because she has had difficulty understanding why her periods are so irregular.  Also have rectal bleeding from hemorrhoids, had to cancel hemorrhoid procedure due to ongoing bleeding.  PCOS: Treated with metformin in past but caused hair loss so she stopped it 2024 recommend POP, however patient declined treatment  10/09/22 TSH wnl 10/20/23 Hgb 11.5 12/04/21 testosterone 34 (normal)  Today, she reports she stopped megace  in September, had 10d of bleeding in October with severe cramping. Did not take ibuprofen or megace .  No longer bleeding or cramping No CP or SOB with activity Planning to start anti-inflammatory diet for PCOS  11/02/2023 TVUS: Retroverted uterus, normal size.  Inhomogeneous myometrium with streaky shadowing suggestive of adenomyosis.  Previous fibroid increased in size to 2.2 cm, plus additional fibroid seen today measuring 2.1 cm.  Asymmetrically thickened endometrium with a 1.6 x 1.0 cm echogenic intracavitary mass toward left cornua.  Feeder vessel identified and confirmed on 3D scan.  This mass was not present on prior scan.     Both ovaries small with scant follicles.     No adnexal masses, no free fluid.  GYN HISTORY: No sig hx  OB History  Gravida Para Term Preterm AB Living  5 3 3  2 3   SAB IAB Ectopic Multiple Live Births  1 1   3     # Outcome Date GA Lbr Len/2nd Weight Sex Type Anes PTL Lv  5 Term      CS-Unspec   LIV  4 Term      Vag-Spont   LIV  3 Term      Vag-Spont   LIV  2 SAB           1 IAB            Past  Medical History:  Diagnosis Date   Anemia    Anxiety    Depression    Family history of adverse reaction to anesthesia    states her mother's blood pressure dropped low   GERD (gastroesophageal reflux disease)    Headache(784.0)    Hyperlipidemia    Kidney stone    Polycystic ovaries 05/15/2016   Thyroid  condition    Past Surgical History:  Procedure Laterality Date   CESAREAN SECTION  2013   NAILBED REPAIR Right 07/13/2022   Procedure: NAIL/NAILBED BIOPSY RIGHT MIDDLE FINGER;  Surgeon: Murrell Drivers, MD;  Location: [facility];  Service: Orthopedics;  Laterality: Right;   Current Outpatient Medications on File Prior to Visit  Medication Sig Dispense Refill   ferrous sulfate 325 (65 FE) MG tablet Take 325 mg by mouth daily.  0   Multiple Vitamin (MULTIVITAMIN) capsule Take 1 capsule by mouth daily.     polyethylene glycol powder (GLYCOLAX /MIRALAX ) 17 GM/SCOOP powder Take 17 g by mouth 2 (two) times daily as needed. 3350 g 1   Psyllium (METAMUCIL PO) Take by mouth.     VITAMIN D  PO Take by mouth.     megestrol  (MEGACE ) 40 MG tablet Take 1 tablet (40 mg total) by mouth 2 (two) times daily. (Patient not taking: Reported  on 12/22/2023) 20 tablet 1   No current facility-administered medications on file prior to visit.   Allergies  Allergen Reactions   Levonorgestrel-Ethinyl Estrad     Other Reaction(s): Unknown      PE Today's Vitals   12/22/23 1153  BP: 104/62  Pulse: 78  Temp: 98 F (36.7 C)  TempSrc: Oral  SpO2: 98%  Weight: 191 lb (86.6 kg)  Height: 5' 5.5 (1.664 m)    Body mass index is 31.3 kg/m.  Physical Exam Vitals reviewed.  Constitutional:      General: She is not in acute distress.    Appearance: Normal appearance.  HENT:     Head: Normocephalic and atraumatic.     Nose: Nose normal.  Eyes:     Extraocular Movements: Extraocular movements intact.     Conjunctiva/sclera: Conjunctivae normal.  Cardiovascular:     Rate and Rhythm:  Normal rate and regular rhythm.     Heart sounds: No murmur heard.    No friction rub. No gallop.  Pulmonary:     Effort: Pulmonary effort is normal. No respiratory distress.     Breath sounds: Normal breath sounds. No stridor. No wheezing, rhonchi or rales.  Musculoskeletal:        General: Normal range of motion.     Cervical back: Normal range of motion.  Neurological:     General: No focal deficit present.     Mental Status: She is alert.  Psychiatric:        Mood and Affect: Mood normal.        Behavior: Behavior normal.      Assessment and Plan:        Endometrial mass  Abnormal uterine bleeding Assessment & Plan: Plan for hysteroscopy, dilation and curettage, polypectomy. Encouraged continued megace  for AUB until procedure is complete  Discussed outpatient procedure. Reviewed that  recovery is usually 1-2 days. Risks including infections, bleeding, and damage to surrounding organs reviewed. Recommend NPO prior to midnight and reviewed medication to take on day of surgery. Dicussed use of NSAIDS as needed for pain postoperatively.  Preop checklist: Antibiotics: none DVT ppx: SCDs Postop visit: 1 week Additional clearance: none       Vera LULLA Pa, MD

## 2023-12-22 NOTE — Patient Instructions (Signed)
 Preoperative instructions: Nothing to eat or drink after midnight, unless instructed differently regarding clear liquids by the anesthesia team at [facility]. Do not take any medications on the day of surgery, except those listed below: NONE Please follow all other instructions as provided by our surgical scheduler at [facility] and the anesthesia team at [facility].  Postoperative instructions: Take ibuprofen as prescribed and over-the-counter Tylenol as needed.

## 2023-12-27 ENCOUNTER — Encounter: Payer: Self-pay | Admitting: *Deleted

## 2024-01-06 ENCOUNTER — Encounter (HOSPITAL_COMMUNITY): Payer: Self-pay | Admitting: Obstetrics and Gynecology

## 2024-01-06 NOTE — Progress Notes (Addendum)
 Spoke w/ via phone for pre-op interview---[facility] needs dos----   UPT      Lab results------ COVID test -----patient states asymptomatic no test needed Arrive at -------1100 NPO after MN NO Solid Food.  Clear liquids from MN until---1000 Pre-Surgery Ensure or G2:  Med rec completed Medications to take morning of surgery -----n/a Diabetic medication -----n/a  GLP1 agonist last dose:n/a GLP1 instructions:n/a  Patient instructed no nail polish to be worn day of surgery Patient instructed to bring photo id and insurance card day of surgery Patient aware to have Driver (ride ) / caregiver    for 24 hours after surgery - Lorene (mom) or Daughter Patient Special Instructions -----N/A Pre-Op special Instructions -----  Patient verbalized understanding of instructions that were given at this phone interview. Patient denies chest pain, sob, fever, cough at the interview.

## 2024-01-13 ENCOUNTER — Ambulatory Visit (HOSPITAL_COMMUNITY)
Admission: RE | Admit: 2024-01-13 | Discharge: 2024-01-13 | Disposition: A | Attending: Obstetrics and Gynecology | Admitting: Obstetrics and Gynecology

## 2024-01-13 ENCOUNTER — Encounter (HOSPITAL_COMMUNITY): Payer: Self-pay | Admitting: Obstetrics and Gynecology

## 2024-01-13 ENCOUNTER — Ambulatory Visit (HOSPITAL_COMMUNITY): Admitting: Anesthesiology

## 2024-01-13 ENCOUNTER — Other Ambulatory Visit: Payer: Self-pay

## 2024-01-13 ENCOUNTER — Encounter (HOSPITAL_COMMUNITY)
Admission: RE | Disposition: A | Discharge status: Home / Self Care | Payer: Self-pay | Source: Home / Self Care | Attending: Obstetrics and Gynecology

## 2024-01-13 DIAGNOSIS — N9489 Other specified conditions associated with female genital organs and menstrual cycle: Secondary | ICD-10-CM | POA: Diagnosis present

## 2024-01-13 DIAGNOSIS — N939 Abnormal uterine and vaginal bleeding, unspecified: Secondary | ICD-10-CM | POA: Diagnosis present

## 2024-01-13 DIAGNOSIS — Z01818 Encounter for other preprocedural examination: Secondary | ICD-10-CM

## 2024-01-13 HISTORY — PX: DILATATION & CURRETTAGE/HYSTEROSCOPY WITH RESECTOCOPE: SHX5572

## 2024-01-13 LAB — POCT PREGNANCY, URINE: Preg Test, Ur: NEGATIVE

## 2024-01-13 SURGERY — Surgical Case
Anesthesia: *Unknown

## 2024-01-13 SURGERY — DILATATION & CURETTAGE/HYSTEROSCOPY WITH RESECTOCOPE
Anesthesia: Monitor Anesthesia Care

## 2024-01-13 MED ORDER — LACTATED RINGERS IV SOLN: INTRAVENOUS | Status: DC

## 2024-01-13 MED ORDER — PROPOFOL 500 MG/50ML IV EMUL
INTRAVENOUS | Status: DC | PRN
  Administered 2024-01-13: 100 ug/kg/min via INTRAVENOUS

## 2024-01-13 MED ORDER — ACETAMINOPHEN 500 MG PO TABS
1000.0000 mg | ORAL_TABLET | ORAL | Status: AC
  Administered 2024-01-13: 1000 mg via ORAL

## 2024-01-13 MED ORDER — OXYCODONE HCL 5 MG PO TABS: 5.0000 mg | ORAL_TABLET | Freq: Once | ORAL | Status: DC | PRN

## 2024-01-13 MED ORDER — PROPOFOL 10 MG/ML IV BOLUS
INTRAVENOUS | Status: DC | PRN
  Administered 2024-01-13: 100 mg via INTRAVENOUS

## 2024-01-13 MED ORDER — ORAL CARE MOUTH RINSE: 15.0000 mL | Freq: Once | OROMUCOSAL | Status: AC

## 2024-01-13 MED ORDER — FENTANYL CITRATE (PF) 100 MCG/2ML IJ SOLN
INTRAMUSCULAR | Status: AC
  Filled 2024-01-13: qty 2

## 2024-01-13 MED ORDER — MIDAZOLAM HCL 2 MG/2ML IJ SOLN
INTRAMUSCULAR | Status: AC
  Filled 2024-01-13: qty 2

## 2024-01-13 MED ORDER — SODIUM CHLORIDE 0.9 % IR SOLN
Status: DC | PRN
  Administered 2024-01-13: 3000 mL

## 2024-01-13 MED ORDER — MIDAZOLAM HCL (PF) 2 MG/2ML IJ SOLN: 0.5000 mg | Freq: Once | INTRAMUSCULAR | Status: DC | PRN

## 2024-01-13 MED ORDER — CHLORHEXIDINE GLUCONATE 0.12 % MT SOLN
15.0000 mL | Freq: Once | OROMUCOSAL | Status: AC
  Administered 2024-01-13: 15 mL via OROMUCOSAL

## 2024-01-13 MED ORDER — ACETAMINOPHEN 500 MG PO TABS
ORAL_TABLET | ORAL | Status: AC
  Filled 2024-01-13: qty 2

## 2024-01-13 MED ORDER — FENTANYL CITRATE (PF) 100 MCG/2ML IJ SOLN: 25.0000 ug | INTRAMUSCULAR | Status: DC | PRN

## 2024-01-13 MED ORDER — CHLORHEXIDINE GLUCONATE 0.12 % MT SOLN
OROMUCOSAL | Status: AC
  Filled 2024-01-13: qty 15

## 2024-01-13 MED ORDER — FENTANYL CITRATE (PF) 100 MCG/2ML IJ SOLN
INTRAMUSCULAR | Status: DC | PRN
  Administered 2024-01-13 (×2): 25 ug via INTRAVENOUS

## 2024-01-13 MED ORDER — MIDAZOLAM HCL (PF) 2 MG/2ML IJ SOLN
INTRAMUSCULAR | Status: DC | PRN
  Administered 2024-01-13: 2 mg via INTRAVENOUS

## 2024-01-13 MED ORDER — OXYCODONE HCL 5 MG/5ML PO SOLN: 5.0000 mg | Freq: Once | ORAL | Status: DC | PRN

## 2024-01-13 MED ORDER — MEPERIDINE HCL 25 MG/ML IJ SOLN: 6.2500 mg | INTRAMUSCULAR | Status: DC | PRN

## 2024-01-13 MED ORDER — CELECOXIB 200 MG PO CAPS
ORAL_CAPSULE | ORAL | Status: AC
  Filled 2024-01-13: qty 2

## 2024-01-13 MED ORDER — CELECOXIB 200 MG PO CAPS
400.0000 mg | ORAL_CAPSULE | ORAL | Status: AC
  Administered 2024-01-13: 400 mg via ORAL

## 2024-01-13 MED ORDER — LIDOCAINE HCL (PF) 1 % IJ SOLN
INTRAMUSCULAR | Status: DC | PRN
  Administered 2024-01-13: 10 mL

## 2024-01-13 SURGICAL SUPPLY — 19 items
BAG DRAIN URO-CYSTO SKYTR STRL (DRAIN) IMPLANT
COVER MAYO STAND STRL (DRAPES) ×1 IMPLANT
DEVICE MYOSURE LITE (MISCELLANEOUS) IMPLANT
DEVICE MYOSURE REACH (MISCELLANEOUS) IMPLANT
DILATOR CANAL MILEX (MISCELLANEOUS) IMPLANT
GAUZE 4X4 16PLY ~~LOC~~+RFID DBL (SPONGE) IMPLANT
GLOVE BIO SURGEON STRL SZ7 (GLOVE) ×1 IMPLANT
GLOVE BIOGEL PI IND STRL 7.0 (GLOVE) ×1 IMPLANT
GOWN STRL REUS W/ TWL XL LVL3 (GOWN DISPOSABLE) ×1 IMPLANT
HIBICLENS CHG 4% 4OZ BTL (MISCELLANEOUS) ×1 IMPLANT
KIT PROCED FLUENT PRO FLT212S (KITS) ×1 IMPLANT
KIT TURNOVER KIT B (KITS) ×1 IMPLANT
PACK VAGINAL MINOR WOMEN LF (CUSTOM PROCEDURE TRAY) ×1 IMPLANT
PAD OB MATERNITY 11 LF (PERSONAL CARE ITEMS) ×1 IMPLANT
PAD PREP 24X48 CUFFED NSTRL (MISCELLANEOUS) ×1 IMPLANT
SEAL ROD LENS SCOPE MYOSURE (ABLATOR) ×1 IMPLANT
SOL .9 NS 3000ML IRR UROMATIC (IV SOLUTION) ×1 IMPLANT
SYSTEM TISS REMOVAL MYOSURE XL (MISCELLANEOUS) IMPLANT
TOWEL OR 17X24 6PK STRL BLUE (TOWEL DISPOSABLE) ×1 IMPLANT

## 2024-01-13 NOTE — Transfer of Care (Signed)
 Immediate Anesthesia Transfer of Care Note  Patient: Jaclyn Macdonald  Procedure(s) Performed: DILATATION & CURETTAGE/HYSTEROSCOPY WITH RESECTOCOPE  Patient Location: PACU  Anesthesia Type:MAC  Level of Consciousness: awake, alert , and oriented  Airway & Oxygen Therapy: Patient Spontanous Breathing  Post-op Assessment: Report given to RN and Post -op Vital signs reviewed and stable  Post vital signs: Reviewed and stable  Last Vitals:  Vitals Value Taken Time  BP 93/63 01/13/24 13:47  Temp    Pulse 87 01/13/24 13:48  Resp 16 01/13/24 13:48  SpO2 96 % 01/13/24 13:48  Vitals shown include unfiled device data.  Last Pain:  Vitals:   01/13/24 1121  TempSrc: Oral  PainSc: 0-No pain         Complications: No notable events documented.

## 2024-01-13 NOTE — Anesthesia Postprocedure Evaluation (Signed)
 Anesthesia Post Note  Patient: Jaclyn Macdonald  Procedure(s) Performed: DILATATION & CURETTAGE/HYSTEROSCOPY WITH RESECTOCOPE     Patient location during evaluation: PACU Anesthesia Type: MAC Level of consciousness: awake and alert, oriented and patient cooperative Pain management: pain level controlled Vital Signs Assessment: post-procedure vital signs reviewed and stable Respiratory status: spontaneous breathing, nonlabored ventilation and respiratory function stable Cardiovascular status: blood pressure returned to baseline and stable Postop Assessment: no apparent nausea or vomiting Anesthetic complications: no   No notable events documented.  Last Vitals:  Vitals:   01/13/24 1400 01/13/24 1415  BP: 109/77 (!) 123/90  Pulse: 79 81  Resp: 16 17  Temp:    SpO2: 96% 98%    Last Pain:  Vitals:   01/13/24 1347  TempSrc:   PainSc: 0-No pain                 Isatou Agredano,E. De Libman

## 2024-01-13 NOTE — Op Note (Signed)
 01/13/2024 Jaclyn Macdonald [id number]  OPERATIVE REPORT  Preop Diagnosis: Pre-Op Diagnosis Codes:     * Abnormal uterine bleeding (AUB) [N93.9]    * Endometrial mass [N94.89]   Post operative diagnosis: same  Procedure: Procedures: DILATATION & CURETTAGE/HYSTEROSCOPY WITH RESECTOCOPE   Surgeon: Vera LULLA Pa, MD Assistant: none  Anesthesia: MAC Fluids: please see anesthesia report Fluid deficit: 125cc  Complications: None  Findings: Normal cervix. Uterine cavity measuring 11cm. 1 endometrial polyp was noted amongst polypoid endometrium.   Estimated blood loss: Minimal  Specimens:  ID Type Source Tests Collected by Time Destination  1 : Endometrial polyp/EMC Tissue PATH Soft tissue SURGICAL PATHOLOGY Pa Vera LULLA, MD 01/13/2024 1128     Disposition of specimen: Pathology    Procedure: Patient was taken to the OR where she was placed in dorsal lithotomy in stirrups. She voided prior to transport. SCDs were in place.  The patient was prepped and draped in the usual sterile fashion. An adequate timeout was obtained and everyone agreed. A bivalve speculum was placed inside the vagina and the cervix visualized. The cervix was grasped anteriorly with a single-tooth tenaculum. Paracervical block was performed with 1% lidocaine.  The uterus sounded to 11 cm. Sequential cervical dilation was done to 27fr, and the myosure hysteroscope was introduced into the uterine cavity.  Findings as noted. Endometrial polyp was completely morcellated using Myosure. The myosure was also used to sample the entire cavity.  A sharp curettage was then performed to ensure complete sampling of the cavity and was sent to pathology. All instrument, sponge and lap counts were correct x2. The patient was awakened taken to recovery room in stable condition.  Vera LULLA Pa, MD 01/13/2024 1:41 PM

## 2024-01-13 NOTE — Anesthesia Preprocedure Evaluation (Addendum)
 Anesthesia Evaluation  Patient identified by MRN, date of birth, ID band Patient awake    Reviewed: Allergy & Precautions, NPO status , Patient's Chart, lab work & pertinent test results  History of Anesthesia Complications Negative for: history of anesthetic complications  Airway Mallampati: II  TM Distance: >3 FB Neck ROM: Full    Dental  (+) Dental Advisory Given   Pulmonary former smoker   breath sounds clear to auscultation       Cardiovascular (-) angina negative cardio ROS  Rhythm:Regular Rate:Normal     Neuro/Psych  Headaches  Anxiety Depression       GI/Hepatic Neg liver ROS,GERD  Controlled,,  Endo/Other  PCOS BMI 33  Renal/GU negative Renal ROS     Musculoskeletal   Abdominal   Peds  Hematology negative hematology ROS (+)   Anesthesia Other Findings   Reproductive/Obstetrics Endometrial mass                              Anesthesia Physical Anesthesia Plan  ASA: 2  Anesthesia Plan: MAC   Post-op Pain Management: Tylenol  PO (pre-op)*   Induction:   PONV Risk Score and Plan: 2 and Ondansetron  and Dexamethasone  Airway Management Planned: Natural Airway and Simple Face Mask  Additional Equipment: None  Intra-op Plan:   Post-operative Plan:   Informed Consent: I have reviewed the patients History and Physical, chart, labs and discussed the procedure including the risks, benefits and alternatives for the proposed anesthesia with the patient or authorized representative who has indicated his/her understanding and acceptance.     Dental advisory given  Plan Discussed with: CRNA and Surgeon  Anesthesia Plan Comments: (Pt prefers MAC)         Anesthesia Quick Evaluation

## 2024-01-13 NOTE — Interval H&P Note (Signed)
 Date of Initial H&P: 12/22/23  History reviewed, patient examined, no change in status, stable for surgery.

## 2024-01-14 ENCOUNTER — Encounter (HOSPITAL_COMMUNITY): Payer: Self-pay | Admitting: Obstetrics and Gynecology

## 2024-01-17 ENCOUNTER — Ambulatory Visit: Payer: Self-pay | Admitting: Obstetrics and Gynecology

## 2024-01-17 LAB — SURGICAL PATHOLOGY

## 2024-01-24 ENCOUNTER — Encounter: Payer: Self-pay | Admitting: Obstetrics and Gynecology

## 2024-01-24 ENCOUNTER — Ambulatory Visit: Admitting: Obstetrics and Gynecology

## 2024-01-24 VITALS — BP 98/62 | HR 79 | Temp 98.1°F | Wt 194.0 lb

## 2024-01-24 DIAGNOSIS — Z09 Encounter for follow-up examination after completed treatment for conditions other than malignant neoplasm: Secondary | ICD-10-CM

## 2024-01-24 NOTE — Progress Notes (Signed)
 "  43 y.o. [id number] female with ?PCOS, AUB-F here for postop exam: 2wk s/p dx hyst, D&C, polypectomy for endometrial mass. Single. Referred by T. Prentiss, NP.  Patient's last menstrual period was 01/17/2024 (exact date). Period Duration (Days): 5 Period Pattern: Regular Menstrual Flow: Light Menstrual Control: Maxi pad Dysmenorrhea: (!) Mild Dysmenorrhea Symptoms: Headache  Pt states she is doing well. Denies N/V, abdominal pain, VB, dysuria. Normal BM and voids.  01/13/24 path: A. ENDOMETRIUM, POLYP AND CURETTAGE:  Proliferative endometrium.  Benign endometrial polyp  Negative for endometrial intraepithelial neoplasia (EIN) and malignancy.   GYN HISTORY: No sig hx  OB History  Gravida Para Term Preterm AB Living  5 3 3  2 3   SAB IAB Ectopic Multiple Live Births  1 1   3     # Outcome Date GA Lbr Len/2nd Weight Sex Type Anes PTL Lv  5 Term      CS-Unspec   LIV  4 Term      Vag-Spont   LIV  3 Term      Vag-Spont   LIV  2 SAB           1 IAB            Past Medical History:  Diagnosis Date   Anemia    Anxiety    Depression    Family history of adverse reaction to anesthesia    states her mother's blood pressure dropped low   GERD (gastroesophageal reflux disease)    Headache(784.0)    Hyperlipidemia    Kidney stone    Polycystic ovaries 05/15/2016   Thyroid  condition    low TSH   Past Surgical History:  Procedure Laterality Date   CESAREAN SECTION  2013   DILATATION & CURRETTAGE/HYSTEROSCOPY WITH RESECTOCOPE N/A 01/13/2024   Procedure: DILATATION & CURETTAGE/HYSTEROSCOPY WITH RESECTOCOPE;  Surgeon: Dallie Vera GAILS, MD;  Location: [facility] OR;  Service: Gynecology;  Laterality: N/A;  Myosure; polypectomy   NAILBED REPAIR Right 07/13/2022   Procedure: NAIL/NAILBED BIOPSY RIGHT MIDDLE FINGER;  Surgeon: Murrell Drivers, MD;  Location: [facility];  Service: Orthopedics;  Laterality: Right;   Current Outpatient Medications on File Prior to Visit  Medication  Sig Dispense Refill   ferrous sulfate 325 (65 FE) MG tablet Take 325 mg by mouth daily.  0   Multiple Vitamin (MULTIVITAMIN) capsule Take 1 capsule by mouth daily.     polyethylene glycol powder (GLYCOLAX /MIRALAX ) 17 GM/SCOOP powder Take 17 g by mouth 2 (two) times daily as needed. 3350 g 1   Psyllium (METAMUCIL PO) Take by mouth.     VITAMIN D  PO Take by mouth.     megestrol  (MEGACE ) 40 MG tablet Take 1 tablet (40 mg total) by mouth 2 (two) times daily. (Patient not taking: Reported on 01/24/2024) 20 tablet 1   No current facility-administered medications on file prior to visit.   Allergies  Allergen Reactions   Other Hives and Itching    Oxycodone  or hydrocodone, pt can't remember      PE Today's Vitals   01/24/24 0856  BP: 98/62  Pulse: 79  Temp: 98.1 F (36.7 C)  TempSrc: Oral  SpO2: 99%  Weight: 194 lb (88 kg)    Body mass index is 32.79 kg/m.  Physical Exam Vitals reviewed.  Constitutional:      General: She is not in acute distress.    Appearance: Normal appearance.  HENT:     Head: Normocephalic and atraumatic.     Nose:  Nose normal.  Eyes:     Extraocular Movements: Extraocular movements intact.     Conjunctiva/sclera: Conjunctivae normal.  Pulmonary:     Effort: Pulmonary effort is normal.  Abdominal:     General: There is no distension.     Palpations: Abdomen is soft.     Tenderness: There is no abdominal tenderness.  Musculoskeletal:        General: Normal range of motion.     Cervical back: Normal range of motion.  Neurological:     General: No focal deficit present.     Mental Status: She is alert.  Psychiatric:        Mood and Affect: Mood normal.        Behavior: Behavior normal.      Assessment and Plan:        Postop check  Normal postop exam.  Pathology reviewed with patient. Cleared to return to work. AUB- discussed hormonal options and lysteda, r/b/I/c reviewed. Undecided at this time. Will call if she decided on an option. All  questions answered.   Vera LULLA Pa, MD  "

## 2024-02-28 ENCOUNTER — Ambulatory Visit: Admitting: Gastroenterology
